# Patient Record
Sex: Female | Born: 1975 | Race: White | Hispanic: No | Smoking: Current some day smoker
Health system: Southern US, Community
[De-identification: ages and names within clinical notes are randomized; demographics above are authoritative.]

## PROBLEM LIST (undated history)

## (undated) DIAGNOSIS — S90111A Contusion of right great toe without damage to nail, initial encounter: Secondary | ICD-10-CM

## (undated) DIAGNOSIS — F1910 Other psychoactive substance abuse, uncomplicated: Secondary | ICD-10-CM

## (undated) DIAGNOSIS — F209 Schizophrenia, unspecified: Secondary | ICD-10-CM

## (undated) DIAGNOSIS — F319 Bipolar disorder, unspecified: Secondary | ICD-10-CM

## (undated) HISTORY — PX: DENTAL SURGERY: SHX609

---

## 2003-09-24 ENCOUNTER — Emergency Department (HOSPITAL_COMMUNITY): Admission: EM | Admit: 2003-09-24 | Discharge: 2003-09-24 | Payer: Self-pay | Admitting: Emergency Medicine

## 2005-10-04 ENCOUNTER — Ambulatory Visit: Payer: Self-pay | Admitting: *Deleted

## 2005-10-04 ENCOUNTER — Inpatient Hospital Stay (HOSPITAL_COMMUNITY): Admission: AD | Admit: 2005-10-04 | Discharge: 2005-10-06 | Payer: Self-pay | Admitting: Psychiatry

## 2006-10-17 ENCOUNTER — Ambulatory Visit: Payer: Self-pay | Admitting: Psychiatry

## 2006-10-17 ENCOUNTER — Emergency Department (HOSPITAL_COMMUNITY): Admission: EM | Admit: 2006-10-17 | Discharge: 2006-10-17 | Payer: Self-pay | Admitting: Emergency Medicine

## 2006-10-17 ENCOUNTER — Inpatient Hospital Stay (HOSPITAL_COMMUNITY): Admission: EM | Admit: 2006-10-17 | Discharge: 2006-10-22 | Payer: Self-pay | Admitting: Psychiatry
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
## 2010-12-09 LAB — DIFFERENTIAL
Basophils Absolute: 0
Basophils Relative: 0
Lymphs Abs: 3.5 — ABNORMAL HIGH
Monocytes Relative: 7
Neutro Abs: 9.7 — ABNORMAL HIGH

## 2010-12-09 LAB — BASIC METABOLIC PANEL
BUN: 4 — ABNORMAL LOW
CO2: 28
Calcium: 8.6
Creatinine, Ser: 0.68
GFR calc Af Amer: >60
Sodium: 135

## 2010-12-09 LAB — URINALYSIS, ROUTINE W REFLEX MICROSCOPIC
Glucose, UA: NEGATIVE
pH: 7

## 2010-12-09 LAB — ETHANOL: Alcohol, Ethyl (B): <5

## 2010-12-09 LAB — COMPREHENSIVE METABOLIC PANEL
ALT: 11
Albumin: 4
Alkaline Phosphatase: 52
Chloride: 105
GFR calc Af Amer: >60
Sodium: 141
Total Bilirubin: 0.7
Total Protein: 7

## 2010-12-09 LAB — CBC
Hemoglobin: 11.8 — ABNORMAL LOW
MCHC: 34
MCV: 90.5
RDW: 13.2
WBC: 14.4 — ABNORMAL HIGH
WBC: 9.6

## 2010-12-09 LAB — TSH: TSH: 0.227 — ABNORMAL LOW

## 2010-12-09 LAB — RAPID URINE DRUG SCREEN, HOSP PERFORMED
Amphetamines: NOT DETECTED
Barbiturates: NOT DETECTED

## 2010-12-09 LAB — URINE MICROSCOPIC-ADD ON

## 2010-12-09 LAB — PREGNANCY, URINE: Preg Test, Ur: NEGATIVE

## 2011-06-11 ENCOUNTER — Emergency Department (HOSPITAL_COMMUNITY): Payer: Medicare Other

## 2011-06-11 ENCOUNTER — Encounter (HOSPITAL_COMMUNITY): Payer: Self-pay | Admitting: *Deleted

## 2011-06-11 ENCOUNTER — Emergency Department (HOSPITAL_COMMUNITY)
Admission: EM | Admit: 2011-06-11 | Discharge: 2011-06-12 | Disposition: A | Discharge status: Other Acute Inpatient Hospital | Payer: Medicare Other | Attending: Emergency Medicine | Admitting: Emergency Medicine

## 2011-06-11 ENCOUNTER — Ambulatory Visit (HOSPITAL_COMMUNITY)
Admission: RE | Admit: 2011-06-11 | Discharge: 2011-06-11 | Disposition: A | Discharge status: Home / Self Care | Payer: Medicare Other | Source: Ambulatory Visit | Attending: Psychiatry | Admitting: Psychiatry

## 2011-06-11 DIAGNOSIS — F1110 Opioid abuse, uncomplicated: Secondary | ICD-10-CM | POA: Insufficient documentation

## 2011-06-11 DIAGNOSIS — F17200 Nicotine dependence, unspecified, uncomplicated: Secondary | ICD-10-CM | POA: Insufficient documentation

## 2011-06-11 DIAGNOSIS — F1510 Other stimulant abuse, uncomplicated: Secondary | ICD-10-CM | POA: Insufficient documentation

## 2011-06-11 DIAGNOSIS — S90111A Contusion of right great toe without damage to nail, initial encounter: Secondary | ICD-10-CM

## 2011-06-11 DIAGNOSIS — F29 Unspecified psychosis not due to a substance or known physiological condition: Secondary | ICD-10-CM | POA: Insufficient documentation

## 2011-06-11 DIAGNOSIS — D72829 Elevated white blood cell count, unspecified: Secondary | ICD-10-CM | POA: Insufficient documentation

## 2011-06-11 DIAGNOSIS — F319 Bipolar disorder, unspecified: Secondary | ICD-10-CM | POA: Insufficient documentation

## 2011-06-11 DIAGNOSIS — F209 Schizophrenia, unspecified: Secondary | ICD-10-CM | POA: Insufficient documentation

## 2011-06-11 DIAGNOSIS — B369 Superficial mycosis, unspecified: Secondary | ICD-10-CM | POA: Insufficient documentation

## 2011-06-11 HISTORY — DX: Schizophrenia, unspecified: F20.9

## 2011-06-11 HISTORY — DX: Bipolar disorder, unspecified: F31.9

## 2011-06-11 HISTORY — DX: Contusion of right great toe without damage to nail, initial encounter: S90.111A

## 2011-06-11 HISTORY — DX: Other psychoactive substance abuse, uncomplicated: F19.10

## 2011-06-11 LAB — URINALYSIS, ROUTINE W REFLEX MICROSCOPIC
Bilirubin Urine: NEGATIVE
Glucose, UA: NEGATIVE mg/dL
Specific Gravity, Urine: 1.006 (ref 1.005–1.030)
pH: 6.5 (ref 5.0–8.0)

## 2011-06-11 LAB — CBC
HCT: 40.4 % (ref 36.0–46.0)
MCH: 29.8 pg (ref 26.0–34.0)
Platelets: 305 10*3/uL (ref 150–400)

## 2011-06-11 LAB — ETHANOL: Alcohol, Ethyl (B): <11 mg/dL (ref 0–11)

## 2011-06-11 LAB — COMPREHENSIVE METABOLIC PANEL
BUN: 6 mg/dL (ref 6–23)
Chloride: 99 mEq/L (ref 96–112)
Creatinine, Ser: 0.71 mg/dL (ref 0.50–1.10)
Potassium: 3.5 mEq/L (ref 3.5–5.1)
Sodium: 136 mEq/L (ref 135–145)

## 2011-06-11 LAB — RAPID URINE DRUG SCREEN, HOSP PERFORMED
Amphetamines: NOT DETECTED
Barbiturates: NOT DETECTED
Benzodiazepines: NOT DETECTED
Cocaine: NOT DETECTED
Tetrahydrocannabinol: NOT DETECTED

## 2011-06-11 LAB — URINE MICROSCOPIC-ADD ON

## 2011-06-11 MED ORDER — NICOTINE 21 MG/24HR TD PT24
21.0000 mg | MEDICATED_PATCH | Freq: Every day | TRANSDERMAL | Status: DC
  Administered 2011-06-11 – 2011-06-12 (×2): 21 mg via TRANSDERMAL
  Filled 2011-06-11 (×2): qty 1

## 2011-06-11 MED ORDER — ZIPRASIDONE MESYLATE 20 MG IM SOLR
10.0000 mg | Freq: Once | INTRAMUSCULAR | Status: AC
  Administered 2011-06-11: 10 mg via INTRAMUSCULAR
  Filled 2011-06-11: qty 20

## 2011-06-11 MED ORDER — IBUPROFEN 200 MG PO TABS: 600.0000 mg | ORAL_TABLET | Freq: Three times a day (TID) | ORAL | Status: DC | PRN

## 2011-06-11 MED ORDER — ZOLPIDEM TARTRATE 5 MG PO TABS: 5.0000 mg | ORAL_TABLET | Freq: Every evening | ORAL | Status: DC | PRN

## 2011-06-11 MED ORDER — HYDROXYZINE HCL 10 MG PO TABS
10.0000 mg | ORAL_TABLET | Freq: Three times a day (TID) | ORAL | Status: DC | PRN
  Administered 2011-06-12 (×2): 10 mg via ORAL
  Filled 2011-06-11 (×3): qty 1

## 2011-06-11 MED ORDER — LORAZEPAM 1 MG PO TABS
1.0000 mg | ORAL_TABLET | Freq: Three times a day (TID) | ORAL | Status: DC | PRN
  Administered 2011-06-11: 1 mg via ORAL
  Filled 2011-06-11: qty 1

## 2011-06-11 MED ORDER — ALUM & MAG HYDROXIDE-SIMETH 200-200-20 MG/5ML PO SUSP: 30.0000 mL | ORAL | Status: DC | PRN

## 2011-06-11 MED ORDER — PALIPERIDONE ER 3 MG PO TB24
3.0000 mg | ORAL_TABLET | Freq: Every day | ORAL | Status: DC
  Administered 2011-06-12: 3 mg via ORAL
  Filled 2011-06-11 (×2): qty 1

## 2011-06-11 MED ORDER — ONDANSETRON HCL 4 MG PO TABS: 4.0000 mg | ORAL_TABLET | Freq: Three times a day (TID) | ORAL | Status: DC | PRN

## 2011-06-11 MED ORDER — ACETAMINOPHEN 325 MG PO TABS: 650.0000 mg | ORAL_TABLET | ORAL | Status: DC | PRN

## 2011-06-11 MED ORDER — ACETAMINOPHEN 325 MG PO TABS: 650.0000 mg | ORAL_TABLET | Freq: Once | ORAL | Status: DC

## 2011-06-11 NOTE — ED Notes (Signed)
[employer] relieved. Pt has calmed down and is quiet in darkened room.

## 2011-06-11 NOTE — ED Notes (Signed)
Patient is resting comfortably. Call light within reach, sitter is present.

## 2011-06-11 NOTE — ED Provider Notes (Addendum)
History     CSN: [id number]  Arrival date & time 06/11/11  1310   First MD Initiated Contact with Patient 06/11/11 1403      Chief Complaint  Patient presents with  . Medical Clearance    (Consider location/radiation/quality/duration/timing/severity/associated sxs/prior treatment) HPI Patient is a 36 year old female with history of prior behavioral health admissions he was taken by her mother to behavioral health today. She has a history of schizophrenia as well as bipolar disorder. Patient was evaluated at behavioral health and sent here for medical clearance. Patient's chief complaint is that she is worried about her right great toe which appears to have a slight fungal infection of the nail. Patient also endorses or redness. Patient expresses tangential thinking and disorganized thoughts. Her vital signs are stable. There are no other associated or modifying factors. Past Medical History  Diagnosis Date  . Contusion of great toe of right foot 06/11/2011  . Schizophrenia   . Bipolar 1 disorder   . Drug abuse     Past Surgical History  Procedure Date  . Dental surgery     History reviewed. No pertinent family history.  History  Substance Use Topics  . Smoking status: Current Everyday Smoker  . Smokeless tobacco: Not on file  . Alcohol Use: Yes    OB History    Grav Para Term Preterm Abortions TAB SAB Ect Mult Living                  Review of Systems  Unable to perform ROS: Psychiatric disorder    Allergies  Lamictal and Seasonal  Home Medications   Current Outpatient Rx  Name Route Sig Dispense Refill  . HYDROXYZINE HCL 10 MG PO TABS Oral Take 10 mg by mouth 3 (three) times daily as needed. Milligram dosage and frequency unspecified.  She overdosed on 06/08/2011.    Marland Kitchen PALIPERIDONE ER 3 MG PO TB24 Oral Take 3 mg by mouth every morning. Pt receives [place] IM once a month, milligram dosage unspecified.  She has not received it in the past 4 months.       BP 133/96  Pulse 100  Temp(Src) 98.9 F (37.2 C) (Oral)  Resp 20  SpO2 100%  LMP 05/08/2011  Physical Exam  Nursing note and vitals reviewed. GEN: Well-developed, well-nourished female in no distress HEENT: Atraumatic, normocephalic. Oropharynx clear without erythema EYES: PERRLA BL, no scleral icterus. NECK: Trachea midline, no meningismus CV: regular rate and rhythm. No murmurs, rubs, or gallops PULM: No respiratory distress.  No crackles, wheezes, or rales. GI: soft, non-tender. No guarding, rebound, or tenderness. + bowel sounds  GU: deferred Neuro: cranial nerves 2-12 intact, no abnormalities of strength or sensation, A and O x 3 MSK: Patient moves all 4 extremities symmetrically, no deformity, edema, or injury noted.  Patient does appear to have slight fungal infection of the right great toenail. Skin: No rashes petechiae, purpura, or jaundice. 3 scratch marks are noted over the patient's back. These do not have any surrounding erythema that suggest infection. Psych: Patient has tangential thinking. She is very disorganized. She is minimally cooperative  ED Course  Procedures (including critical care time)  Labs Reviewed  CBC - Abnormal; Notable for the following:    WBC 16.5 (*)    All other components within normal limits  COMPREHENSIVE METABOLIC PANEL - Abnormal; Notable for the following:    Total Protein 8.4 (*)    All other components within normal limits  URINALYSIS, ROUTINE W  REFLEX MICROSCOPIC - Abnormal; Notable for the following:    Hgb urine dipstick TRACE (*)    All other components within normal limits  PREGNANCY, URINE  ETHANOL  ACETAMINOPHEN LEVEL  URINE MICROSCOPIC-ADD ON  URINE RAPID DRUG SCREEN (HOSP PERFORMED)   Dg Chest Portable 1 View  06/11/2011  *RADIOLOGY REPORT*  Clinical Data: 36 year old female with confusion, psychosis and schizophrenia.  PORTABLE CHEST - 1 VIEW  Comparison: None  Findings: The cardiomediastinal silhouette is  unremarkable. There is no evidence of focal airspace disease, pulmonary edema, suspicious pulmonary nodule/mass, pleural effusion, or pneumothorax. No acute bony abnormalities are identified.  IMPRESSION: No evidence of acute cardiopulmonary disease.  Original Report Authenticated By: Rosendo Gros, M.D.     1. Psychosis       MDM  Patient was evaluated by myself. Medical clearance was remarkable for a leukocytosis of 16.5 which is nonspecific. Patient did not have a urinary tract infection or pneumonia. She had no rash on my exam. Patient's toenail appeared to have fungal infection which can be treated topically with antifungal cream. Patient had normal renal function, negative pregnancy test, normal Tylenol and alcohol level as well as negative tox screen. IVC was completed prior to her arrival. Patient is awaiting placement in a bed on the psychotic unit. Holding orders have been placed at home medications ordered. Of note patient did require a dose of Geodon 10 mg IM after removing her close multiple times. Patient was cooperative following this.       Cyndra Numbers, MD 06/11/11 1707  Cyndra Numbers, MD 06/11/11 1610  Cyndra Numbers, MD 06/11/11 1710

## 2011-06-11 NOTE — ED Notes (Signed)
Pt came out to the desk to make a phone call.  She dialed 911. She said that she needed to get out of here. She rattled off a lot of nonsense, stating she needed to call [employer], her mom, the police and she needed her numbers. She stated she has SJS and because of that, she has to go outside whether we like it or not. She was advised that she cannot leave and she wanted to know why. She was informed that she was under IVC and she then wanted to know by who and wanted to see the papers.  The papers were showed to her by [employer] who went through them with her in attempt to deescalate the situation which was successful. At present she is resting in bed watching television.

## 2011-06-11 NOTE — ED Notes (Signed)
Pt and mother present with [employer] form [facility] due to behavior problem with Patient, pt rambling in conversation and "wait for my lawyer to talk to anyone" Pt has a psy history. IVC papers with pt. Pt is having disorganized tangential thoughts
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
## 2011-06-11 NOTE — ED Notes (Signed)
Pt attempting to leave unit. Tech preoccupied pt until security could respond who informed [employer] by radio.  Pt was escorted back to room without incident and agreed to try to take Ativan. Pt sat in the floor after administration and claimed that something was trying to "go up her butt." Pt is currently calm and cooperative.

## 2011-06-11 NOTE — BHH Counselor (Signed)
Pt already evaluated by ACT at [facility] as a walk in and sent to [facility] due to need for Inptx.  Pt will be reviewed for placement at [facility] once appropriate bed is available.  No further ACT assessment is needed for today.

## 2011-06-11 NOTE — ED Notes (Signed)
Patient is resting comfortably. 

## 2011-06-11 NOTE — ED Notes (Addendum)
Pt has just taken paper scrubs off. New scrubs given. ED MD notified.

## 2011-06-12 NOTE — ED Notes (Addendum)
Multiple concerns have been expressed by pt in regards to treatment. She has questions about medication needed to treat rt great toenail, a circular rash noted on lt lower extremity, and test for STD. Informed pt that follow up maybe needed for these concerns after discharge. Will address concerns with  Concern with MD.

## 2011-06-12 NOTE — ED Notes (Signed)
Report received-airway intact, no s/s's of distress-sitter at bedside

## 2011-06-12 NOTE — ED Notes (Addendum)
Pt. Woke up and settled back in bed to eat dinner tray. She questioned possibility of going to work today. She states she works at [employer] and is to report there at 2:00pm. Explained to patient plan of care. 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

## 2011-06-12 NOTE — ED Notes (Signed)
Pt. Belongings was moved to locker #40 (2) bags

## 2011-06-12 NOTE — ED Notes (Signed)
Report called to [place], Charity fundraiser at [employer].  Phone number: [phone]. Pt is going to the C[facility], [facility]. Under care of Dr. Robet Leu.  Informed EDP, Dr. Norlene Campbell of pt. admittance to facility.

## 2011-06-12 NOTE — BH Assessment (Signed)
Per Baxter Hire at [facility], pt has been accepted. She will be going to [facility], accepted by Dr. Robet Leu. # to give report [phone]. RN Tana Conch notified and she will notify EDP.

## 2011-06-12 NOTE — ED Notes (Signed)
Pt with multiple requests-hyper verbal, manipulative at times-consistency with staff as far as rules/boundaries

## 2011-06-12 NOTE — ED Notes (Signed)
Report given to rachel, rn-transferred to rm 40

## 2011-06-12 NOTE — ED Provider Notes (Signed)
Pt accepted to [facility] by Dr. Robet Leu.    Gavin Pound. Devereaux Grayson, MD 06/12/11 1239

## 2012-08-17 ENCOUNTER — Encounter (HOSPITAL_COMMUNITY): Payer: Self-pay | Admitting: Emergency Medicine

## 2012-08-17 ENCOUNTER — Emergency Department (HOSPITAL_COMMUNITY)
Admission: EM | Admit: 2012-08-17 | Discharge: 2012-08-17 | Disposition: A | Discharge status: Home / Self Care | Payer: Medicare Other | Attending: Emergency Medicine | Admitting: Emergency Medicine

## 2012-08-17 ENCOUNTER — Emergency Department (HOSPITAL_COMMUNITY): Payer: Medicare Other

## 2012-08-17 DIAGNOSIS — F209 Schizophrenia, unspecified: Secondary | ICD-10-CM | POA: Insufficient documentation

## 2012-08-17 DIAGNOSIS — Z87828 Personal history of other (healed) physical injury and trauma: Secondary | ICD-10-CM | POA: Insufficient documentation

## 2012-08-17 DIAGNOSIS — M25569 Pain in unspecified knee: Secondary | ICD-10-CM | POA: Insufficient documentation

## 2012-08-17 DIAGNOSIS — M25562 Pain in left knee: Secondary | ICD-10-CM

## 2012-08-17 DIAGNOSIS — F17200 Nicotine dependence, unspecified, uncomplicated: Secondary | ICD-10-CM | POA: Insufficient documentation

## 2012-08-17 DIAGNOSIS — F319 Bipolar disorder, unspecified: Secondary | ICD-10-CM | POA: Insufficient documentation

## 2012-08-17 DIAGNOSIS — Z79899 Other long term (current) drug therapy: Secondary | ICD-10-CM | POA: Insufficient documentation

## 2012-08-17 MED ORDER — IBUPROFEN 800 MG PO TABS: 800.0000 mg | ORAL_TABLET | Freq: Three times a day (TID) | ORAL | Status: DC

## 2012-08-17 MED ORDER — TRAMADOL HCL 50 MG PO TABS: 50.0000 mg | ORAL_TABLET | Freq: Four times a day (QID) | ORAL | Status: DC | PRN

## 2012-08-17 NOTE — ED Notes (Signed)
Pt presents to ED with complaints of bilateral knee pain. 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

## 2012-08-17 NOTE — ED Provider Notes (Signed)
Medical screening examination/treatment/procedure(s) were performed by non-physician practitioner and as supervising physician I was immediately available for consultation/collaboration.  Ethelda Chick, MD 08/17/12 Ernestina Columbia

## 2012-08-17 NOTE — ED Provider Notes (Signed)
History  This chart was scribed for non-physician practitioner working with Ethelda Chick, MD by Greggory Stallion, ED scribe. This patient was seen in room TR05C/TR05C and the patient's care was started at 4:34 PM.  CSN: [id number]  Arrival date & time 08/17/12  1614    Chief Complaint  Patient presents with  . Knee Pain    The history is provided by the patient. No language interpreter was used.    HPI Comments: Beth Berry is a 37 y.o. female who presents to the Emergency Department complaining of bilateral knee pain that started 3 days ago. No recent injury or trauma.  Pt states she was doing a massage at work and her knees felt like they were going to give out. She states she has had pain ever since. Pain worse with bending, squatting, and standing, nothing seems to make it better. Pt states she has heard popping noises in her knees. She states she has tried Therapist, sports and ice/heat therapy without relief. Pt denies any numbness or paresthesias of LE.  No pain meds taken PTA.  Past Medical History  Diagnosis Date  . Contusion of great toe of right foot 06/11/2011  . Schizophrenia   . Bipolar 1 disorder   . Drug abuse     Past Surgical History  Procedure Laterality Date  . Dental surgery      History reviewed. No pertinent family history.  History  Substance Use Topics  . Smoking status: Current Every Day Smoker  . Smokeless tobacco: Not on file  . Alcohol Use: Yes    OB History   Grav Para Term Preterm Abortions TAB SAB Ect Mult Living                  Review of Systems  Musculoskeletal: Positive for arthralgias.  All other systems reviewed and are negative.    Allergies  Cholestatin and Lamictal  Home Medications   Current Outpatient Rx  Name  Route  Sig  Dispense  Refill  . hydrOXYzine (ATARAX/VISTARIL) 10 MG tablet   Oral   Take 10 mg by mouth 3 (three) times daily as needed. Milligram dosage and frequency unspecified.  She overdosed on 06/08/2011.          . ibuprofen (ADVIL,MOTRIN) 400 MG tablet   Oral   Take 400 mg by mouth every 6 (six) hours as needed. For pain relief         . paliperidone (INVEGA) 3 MG 24 hr tablet   Oral   Take 3 mg by mouth every morning. Pt receives [place] IM once a month, milligram dosage unspecified.  She has not received it in the past 4 months.           BP 126/78  Pulse 112  Temp(Src) 98.7 F (37.1 C) (Oral)  Resp 18  Ht 5\' 7"  (1.702 m)  Wt 194 lb (87.998 kg)  BMI 30.38 kg/m2  SpO2 99%  Physical Exam  Nursing note and vitals reviewed. Constitutional: She is oriented to person, place, and time. She appears well-developed and well-nourished.  HENT:  Head: Normocephalic and atraumatic.  Eyes: Conjunctivae and EOM are normal.  Neck: Normal range of motion. Neck supple.  Cardiovascular: Normal rate, regular rhythm and normal heart sounds.   Pulmonary/Chest: Effort normal and breath sounds normal.  Musculoskeletal: Normal range of motion. She exhibits no edema.       Right knee: She exhibits normal range of motion, no swelling, no effusion, no ecchymosis, no  deformity, no laceration, no erythema and normal alignment. Tenderness found. Medial joint line tenderness noted.       Left knee: She exhibits normal range of motion, no swelling, no effusion, no ecchymosis, no deformity, no laceration, no erythema and normal alignment. Tenderness found. Medial joint line tenderness noted.  Bilateral knee pain along medial joint lines with crepitus, full ROM maintained, normal gait, distal pulses and sensation intact  Neurological: She is alert and oriented to person, place, and time.  Skin: Skin is warm and dry.  Psychiatric: She has a normal mood and affect.    ED Course  Procedures (including critical care time)  DIAGNOSTIC STUDIES: Oxygen Saturation is 99% on RA, normal by my interpretation.    COORDINATION OF CARE: 4:52 PM-Discussed treatment plan with pt at bedside and pt agreed to  plan.   Labs Reviewed - No data to display Dg Knee 2 Views Left  08/17/2012   *RADIOLOGY REPORT*  Clinical Data: Knee pain  LEFT KNEE - 1-2 VIEW  Comparison: None.  Findings: Joint spaces well maintained.  No effusion or fracture. No arthropathy.  IMPRESSION: No acute findings.  No evidence of arthropathy.  No effusion.   Original Report Authenticated By: Genevive Bi, M.D.   Dg Knee 2 Views Right  08/17/2012   *RADIOLOGY REPORT*  Clinical Data: Knee pain and swelling  RIGHT KNEE - 1-2 VIEW  Comparison: None.  Findings: There is no fracture or  dislocation of the right knee. No joint effusion  IMPRESSION: No fracture or effusion   Original Report Authenticated By: Genevive Bi, M.D.     1. Knee pain, bilateral       MDM   Description of pain and PE findings consistent with arthritis but pt requests x-rays- both negative.  FU with ortho, Dr. Lajoyce Corners.  Rx robaxin and ibuprofen.  Discussed plan with pt, she agreed.  Return precautions advised.    I personally performed the services described in this documentation, which was scribed in my presence. The recorded information has been reviewed and is accurate.   Garlon Hatchet, PA-C 08/17/12 1843

## 2012-09-19 ENCOUNTER — Encounter (HOSPITAL_COMMUNITY): Payer: Self-pay | Admitting: *Deleted

## 2012-09-19 ENCOUNTER — Emergency Department (HOSPITAL_COMMUNITY)
Admission: EM | Admit: 2012-09-19 | Discharge: 2012-09-19 | Disposition: A | Discharge status: Home / Self Care | Payer: Medicare Other | Attending: Emergency Medicine | Admitting: Emergency Medicine

## 2012-09-19 DIAGNOSIS — F319 Bipolar disorder, unspecified: Secondary | ICD-10-CM | POA: Insufficient documentation

## 2012-09-19 DIAGNOSIS — J069 Acute upper respiratory infection, unspecified: Secondary | ICD-10-CM | POA: Insufficient documentation

## 2012-09-19 DIAGNOSIS — F209 Schizophrenia, unspecified: Secondary | ICD-10-CM | POA: Insufficient documentation

## 2012-09-19 DIAGNOSIS — J3489 Other specified disorders of nose and nasal sinuses: Secondary | ICD-10-CM | POA: Insufficient documentation

## 2012-09-19 DIAGNOSIS — R509 Fever, unspecified: Secondary | ICD-10-CM | POA: Insufficient documentation

## 2012-09-19 DIAGNOSIS — R05 Cough: Secondary | ICD-10-CM | POA: Insufficient documentation

## 2012-09-19 DIAGNOSIS — R059 Cough, unspecified: Secondary | ICD-10-CM | POA: Insufficient documentation

## 2012-09-19 DIAGNOSIS — Z79899 Other long term (current) drug therapy: Secondary | ICD-10-CM | POA: Insufficient documentation

## 2012-09-19 DIAGNOSIS — Z87828 Personal history of other (healed) physical injury and trauma: Secondary | ICD-10-CM | POA: Insufficient documentation

## 2012-09-19 DIAGNOSIS — R071 Chest pain on breathing: Secondary | ICD-10-CM | POA: Insufficient documentation

## 2012-09-19 DIAGNOSIS — F1910 Other psychoactive substance abuse, uncomplicated: Secondary | ICD-10-CM | POA: Insufficient documentation

## 2012-09-19 MED ORDER — BENZONATATE 100 MG PO CAPS: 100.0000 mg | ORAL_CAPSULE | Freq: Three times a day (TID) | ORAL | Status: DC | PRN

## 2012-09-19 MED ORDER — ALBUTEROL SULFATE HFA 108 (90 BASE) MCG/ACT IN AERS: 1.0000 | INHALATION_SPRAY | Freq: Four times a day (QID) | RESPIRATORY_TRACT | Status: DC | PRN

## 2012-09-19 NOTE — ED Provider Notes (Signed)
Medical screening examination/treatment/procedure(s) were performed by non-physician practitioner and as supervising physician I was immediately available for consultation/collaboration.   Willene Holian B. Bernette Mayers, MD 09/19/12 2112

## 2012-09-19 NOTE — ED Notes (Signed)
The pt has had a cold for 2-3 days with coughing sinus congestion and a runny nose

## 2012-09-19 NOTE — ED Notes (Signed)
Started 3 days ago with nasal congestion, cough, scratchy throat.

## 2012-09-19 NOTE — ED Provider Notes (Signed)
CSN: [id number]     Arrival date & time 09/19/12  1653 History  HPI Comments: This chart was scribed for Trixie Dredge, PA-C working with Leonette Most B. Bernette Mayers, MD by Greggory Stallion, ED scribe. This patient was seen in room TR08C/TR08C and the patient's care was started at 5:25 PM.   Chief Complaint  Patient presents with  . URI   The history is provided by the patient. No language interpreter was used.    HPI Comments: Beth Berry is a 37 y.o. female who presents to the Emergency Department complaining of gradual onset, nonproductive cough, rhinorrhea, and nasal congestion that started 4 days ago. She states she is having associated chills and body aches. She states she had some right sided chest pain last night while lying in bed that lasted about 1.5 minutes. Pt states nothing makes the symptoms better or worse. She states she had a fever of 101 before she came here but it is resolved now. Pt states she has taken generic Sudafed and cough syrup with no relief. Pt denies SOB, abdominal pain, nausea, emesis, diarrhea. She states she has not had much of an appetite the last 4 days. Has sick contacts in her fiance and fiance's mother who have both been sick with similar symptoms.  Past Medical History  Diagnosis Date  . Contusion of great toe of right foot 06/11/2011  . Schizophrenia   . Bipolar 1 disorder   . Drug abuse    Past Surgical History  Procedure Laterality Date  . Dental surgery     No family history on file. History  Substance Use Topics  . Smoking status: Current Every Day Smoker  . Smokeless tobacco: Not on file  . Alcohol Use: Yes   OB History   Grav Para Term Preterm Abortions TAB SAB Ect Mult Living                 Review of Systems  Constitutional: Positive for fever and chills.  HENT: Positive for congestion and rhinorrhea.   Respiratory: Positive for cough. Negative for shortness of breath.   Cardiovascular: Positive for chest pain.  Gastrointestinal: Negative  for nausea, vomiting, abdominal pain and diarrhea.    Allergies  Lamictal  Home Medications   Current Outpatient Rx  Name  Route  Sig  Dispense  Refill  . clonazePAM (KLONOPIN) 1 MG tablet   Oral   Take 1 mg by mouth daily.         . fluconazole (DIFLUCAN) 200 MG tablet   Oral   Take 200 mg by mouth once a week. On Fridays         . hydrOXYzine (ATARAX/VISTARIL) 25 MG tablet   Oral   Take 25 mg by mouth at bedtime as needed for itching or anxiety (sleep).          Marland Kitchen ibuprofen (ADVIL,MOTRIN) 200 MG tablet   Oral   Take 200-400 mg by mouth 2 (two) times daily as needed (menstrual cramps).         . topiramate (TOPAMAX) 25 MG tablet   Oral   Take 25 mg by mouth 2 (two) times daily.          BP 117/102  Pulse 110  Temp(Src) 98.1 F (36.7 C)  Resp 20  SpO2 98%  LMP 08/20/2012  Physical Exam  Nursing note and vitals reviewed. Constitutional: She appears well-developed and well-nourished. No distress.  HENT:  Head: Normocephalic and atraumatic.  Mouth/Throat: Oropharynx is clear and moist.  No oropharyngeal exudate.  Eyes: Conjunctivae are normal.  Neck: Neck supple.  Cardiovascular: Normal rate and regular rhythm.   Pulmonary/Chest: Effort normal and breath sounds normal. No stridor. No respiratory distress. She has no decreased breath sounds. She has no wheezes. She has no rhonchi. She has no rales.  cough  Lymphadenopathy:    She has no cervical adenopathy.  Neurological: She is alert.  Skin: She is not diaphoretic.    ED Course   Procedures (including critical care time)  DIAGNOSTIC STUDIES: Oxygen Saturation is 98% on RA, normal by my interpretation.    COORDINATION OF CARE: 5:37 PM-Discussed treatment plan which includes cough suppressant and other medications with pt at bedside and pt agreed to plan.   Labs Reviewed - No data to display No results found.  Filed Vitals:   09/19/12 1732  BP: 119/77  Pulse: 122  Temp:   Resp:      1.  URI (upper respiratory infection)     MDM  Pt with URI x 3-4 days.  Lungs CTAB, O2 98%.  HR is mid90s on recheck.  +sick contacts with similar symptoms- I am also seeing and treating her fiance who is here with her.  She is afebrile, nontoxic here.  Encouraged PO fluid increase. Discussed  findings, treatment, follow up  with patient.  Pt given return precautions.  Pt verbalizes understanding and agrees with plan.      I doubt any other EMC precluding discharge at this time including, but not necessarily limited to the following: pneumonia, PE   I personally performed the services described in this documentation, which was scribed in my presence. The recorded information has been reviewed and is accurate.   Trixie Dredge, PA-C 09/19/12 1813

## 2012-12-09 ENCOUNTER — Emergency Department (HOSPITAL_COMMUNITY): Payer: Worker's Compensation

## 2012-12-09 ENCOUNTER — Encounter (HOSPITAL_COMMUNITY): Payer: Self-pay | Admitting: Emergency Medicine

## 2012-12-09 ENCOUNTER — Emergency Department (HOSPITAL_COMMUNITY)
Admission: EM | Admit: 2012-12-09 | Discharge: 2012-12-09 | Disposition: A | Discharge status: Home / Self Care | Payer: Worker's Compensation | Attending: Emergency Medicine | Admitting: Emergency Medicine

## 2012-12-09 DIAGNOSIS — Y929 Unspecified place or not applicable: Secondary | ICD-10-CM | POA: Insufficient documentation

## 2012-12-09 DIAGNOSIS — S59909A Unspecified injury of unspecified elbow, initial encounter: Secondary | ICD-10-CM | POA: Insufficient documentation

## 2012-12-09 DIAGNOSIS — F319 Bipolar disorder, unspecified: Secondary | ICD-10-CM | POA: Insufficient documentation

## 2012-12-09 DIAGNOSIS — F209 Schizophrenia, unspecified: Secondary | ICD-10-CM | POA: Insufficient documentation

## 2012-12-09 DIAGNOSIS — Z79899 Other long term (current) drug therapy: Secondary | ICD-10-CM | POA: Insufficient documentation

## 2012-12-09 DIAGNOSIS — F17200 Nicotine dependence, unspecified, uncomplicated: Secondary | ICD-10-CM | POA: Insufficient documentation

## 2012-12-09 DIAGNOSIS — M25532 Pain in left wrist: Secondary | ICD-10-CM

## 2012-12-09 DIAGNOSIS — Y9389 Activity, other specified: Secondary | ICD-10-CM | POA: Insufficient documentation

## 2012-12-09 DIAGNOSIS — X500XXA Overexertion from strenuous movement or load, initial encounter: Secondary | ICD-10-CM | POA: Insufficient documentation

## 2012-12-09 DIAGNOSIS — S6990XA Unspecified injury of unspecified wrist, hand and finger(s), initial encounter: Secondary | ICD-10-CM | POA: Insufficient documentation

## 2012-12-09 MED ORDER — NAPROXEN 500 MG PO TABS: 500.0000 mg | ORAL_TABLET | Freq: Two times a day (BID) | ORAL | Status: DC

## 2012-12-09 NOTE — ED Notes (Signed)
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

## 2012-12-09 NOTE — Progress Notes (Signed)
Orthopedic Tech Progress Note Patient Details:  Beth Berry [birth year] [id number]  Ortho Devices Type of Ortho Device: Thumb spica splint Splint Material: Fiberglass Ortho Device/Splint Location: left UE Ortho Device/Splint Interventions: Application   Beth Berry T 12/09/2012, 8:44 PM

## 2012-12-10 NOTE — ED Provider Notes (Signed)
CSN: [id number]     Arrival date & time 12/09/12  1856 History   First MD Initiated Contact with Patient 12/09/12 1912     Chief Complaint  Patient presents with  . Wrist Pain   (Consider location/radiation/quality/duration/timing/severity/associated sxs/prior Treatment) HPI Comments: The patient presents with Left wrist pain for 1 week. 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 Pain with active flexion and extension. Reports swelling. She reports icing the area without relief.  Denies fever, elbow or shoulder pain.       Patient is a 37 y.o. female presenting with wrist pain. The history is provided by the patient.  Wrist Pain    Past Medical History  Diagnosis Date  . Contusion of great toe of right foot 06/11/2011  . Schizophrenia   . Bipolar 1 disorder   . Drug abuse    Past Surgical History  Procedure Laterality Date  . Dental surgery     No family history on file. History  Substance Use Topics  . Smoking status: Current Every Day Smoker  . Smokeless tobacco: Not on file  . Alcohol Use: Yes   OB History   Grav Para Term Preterm Abortions TAB SAB Ect Mult Living                 Review of Systems  All other systems reviewed and are negative.    Allergies  Lamictal  Home Medications   Current Outpatient Rx  Name  Route  Sig  Dispense  Refill  . ARIPiprazole (ABILIFY) 30 MG tablet   Oral   Take 30 mg by mouth daily.         . clonazePAM (KLONOPIN) 1 MG tablet   Oral   Take 1 mg by mouth daily as needed for anxiety.          . fluconazole (DIFLUCAN) 200 MG tablet   Oral   Take 200 mg by mouth once a week. On Fridays         . hydrOXYzine (ATARAX/VISTARIL) 25 MG tablet   Oral   Take 25 mg by mouth at bedtime as needed for itching or anxiety (sleep).          Marland Kitchen ibuprofen (ADVIL,MOTRIN) 200 MG tablet   Oral   Take 200-400 mg by mouth 2 (two) times daily as needed (menstrual  cramps).         . naproxen sodium (ANAPROX) 220 MG tablet   Oral   Take 440 mg by mouth daily as needed (for pain).         . naproxen (NAPROSYN) 500 MG tablet   Oral   Take 1 tablet (500 mg total) by mouth 2 (two) times daily.   30 tablet   0    BP 128/80  Pulse 98  Temp(Src) 97.8 F (36.6 C) (Oral)  Resp 18  Wt 209 lb 9 oz (95.057 kg)  BMI 32.81 kg/m2  SpO2 98%  LMP 12/09/2012 Physical Exam  Nursing note and vitals reviewed. Constitutional: She appears well-developed and well-nourished.  HENT:  Head: Normocephalic and atraumatic.  Eyes: EOM are normal.  Neck: Neck supple.  Cardiovascular: Normal rate, regular rhythm and normal heart sounds.   Patient not tachycardic during exam.   Pulmonary/Chest: Effort normal. She has no wheezes.  Musculoskeletal: Normal range of motion.       Left shoulder: Normal. She exhibits no tenderness.  Left elbow: Normal. No tenderness found.       Left wrist: She exhibits tenderness and bony tenderness. She exhibits no crepitus and no deformity.       Left forearm: She exhibits tenderness and swelling.  Tenderness to palpation over anatomical snuff box.  Pain with active flexion and extension.  Normal strength.    Neurological: She is alert.  Skin: Skin is warm and dry.    ED Course  Procedures (including critical care time)  Dg Wrist Complete Left  12/09/2012   CLINICAL DATA:  Pain  EXAM: LEFT WRIST - COMPLETE 3+ VIEW  COMPARISON:  None.  FINDINGS: Frontal, oblique, lateral, and ulnar deviation scaphoid images were obtained. There is no fracture or dislocation. Joint spaces appear intact. No erosive change.  IMPRESSION: No abnormality noted.   Electronically Signed   By: Bretta Bang M.D.   On: 12/09/2012 19:39    MDM   1. Wrist pain, left    Patient presents with Left wrist pain for 8 days, reports mechanism as overuse but tenderness recreated with palpation to anatomical snuff box.  Will place in thumb spica and  have follow up with hand or ortho.    Clabe Seal, PA-C 12/11/12 2222

## 2012-12-12 NOTE — ED Provider Notes (Signed)
Medical screening examination/treatment/procedure(s) were performed by non-physician practitioner and as supervising physician I was immediately available for consultation/collaboration.   Junius Argyle, MD 12/12/12 [phone]

## 2015-01-31 ENCOUNTER — Emergency Department (HOSPITAL_COMMUNITY)
Admission: EM | Admit: 2015-01-31 | Discharge: 2015-01-31 | Disposition: A | Discharge status: Home / Self Care | Payer: Medicare Other | Attending: Emergency Medicine | Admitting: Emergency Medicine

## 2015-01-31 ENCOUNTER — Emergency Department (HOSPITAL_COMMUNITY): Payer: Medicare Other

## 2015-01-31 ENCOUNTER — Encounter (HOSPITAL_COMMUNITY): Payer: Self-pay

## 2015-01-31 DIAGNOSIS — Z79899 Other long term (current) drug therapy: Secondary | ICD-10-CM | POA: Insufficient documentation

## 2015-01-31 DIAGNOSIS — F17210 Nicotine dependence, cigarettes, uncomplicated: Secondary | ICD-10-CM | POA: Insufficient documentation

## 2015-01-31 DIAGNOSIS — Z87828 Personal history of other (healed) physical injury and trauma: Secondary | ICD-10-CM | POA: Insufficient documentation

## 2015-01-31 DIAGNOSIS — M25511 Pain in right shoulder: Secondary | ICD-10-CM

## 2015-01-31 DIAGNOSIS — F209 Schizophrenia, unspecified: Secondary | ICD-10-CM | POA: Diagnosis not present

## 2015-01-31 DIAGNOSIS — F319 Bipolar disorder, unspecified: Secondary | ICD-10-CM | POA: Insufficient documentation

## 2015-01-31 MED ORDER — NAPROXEN 500 MG PO TABS
500.0000 mg | ORAL_TABLET | Freq: Once | ORAL | Status: AC
  Administered 2015-01-31: 500 mg via ORAL
  Filled 2015-01-31: qty 1

## 2015-01-31 MED ORDER — NAPROXEN 500 MG PO TABS: 500.0000 mg | ORAL_TABLET | Freq: Two times a day (BID) | ORAL | Status: DC

## 2015-01-31 MED ORDER — METHOCARBAMOL 500 MG PO TABS: 500.0000 mg | ORAL_TABLET | Freq: Two times a day (BID) | ORAL | Status: DC

## 2015-01-31 MED ORDER — METHOCARBAMOL 500 MG PO TABS
500.0000 mg | ORAL_TABLET | Freq: Once | ORAL | Status: AC
  Administered 2015-01-31: 500 mg via ORAL
  Filled 2015-01-31: qty 1

## 2015-01-31 NOTE — ED Notes (Signed)
Pt c/o R shoulder pain and limited ROM starting this morning.  Pain score 10/10.  Pt has not taken anything for pain.  Denies injury.  Sts she woke up w/ the pain.  Denies numbness and tingling.

## 2015-01-31 NOTE — ED Notes (Signed)
Awake. Verbally responsive. A/O x4. Resp even and unlabored. No audible adventitious breath sounds noted. ABC's intact.  

## 2015-01-31 NOTE — ED Notes (Signed)
Bed: WA27 Expected date:  Expected time:  Means of arrival:  Comments: 

## 2015-01-31 NOTE — Discharge Instructions (Signed)
1. Medications: naproxen, robaxin, usual home medications 2. Treatment: rest, drink plenty of fluids; you can continue to do gentle stretching and massage; use ice or heat for symptoms 3. Follow Up: please followup with orthopedics with persistent or worsening symptoms for discussion of your diagnoses and further evaluation after today's visit; if you do not have a primary care doctor use the resource guide provided to find one; please return to the ER for high fever, severe pain, numbness, weakness, new or worsening symptoms   Shoulder Pain The shoulder is the joint that connects your arms to your body. The bones that form the shoulder joint include the upper arm bone (humerus), the shoulder blade (scapula), and the collarbone (clavicle). The top of the humerus is shaped like a ball and fits into a rather flat socket on the scapula (glenoid cavity). A combination of muscles and strong, fibrous tissues that connect muscles to bones (tendons) support your shoulder joint and hold the ball in the socket. Small, fluid-filled sacs (bursae) are located in different areas of the joint. They act as cushions between the bones and the overlying soft tissues and help reduce friction between the gliding tendons and the bone as you move your arm. Your shoulder joint allows a wide range of motion in your arm. This range of motion allows you to do things like scratch your back or throw a ball. However, this range of motion also makes your shoulder more prone to pain from overuse and injury. Causes of shoulder pain can originate from both injury and overuse and usually can be grouped in the following four categories: 1. Redness, swelling, and pain (inflammation) of the tendon (tendinitis) or the bursae (bursitis). 2. Instability, such as a dislocation of the joint. 3. Inflammation of the joint (arthritis). 4. Broken bone (fracture). HOME CARE INSTRUCTIONS  1. Apply ice to the sore area.  Put ice in a plastic  bag.  Place a towel between your skin and the bag.  Leave the ice on for 15-20 minutes, 3-4 times per day for the first 2 days, or as directed by your health care provider. 2. Stop using cold packs if they do not help with the pain. 3. If you have a shoulder sling or immobilizer, wear it as long as your caregiver instructs. Only remove it to shower or bathe. Move your arm as little as possible, but keep your hand moving to prevent swelling. 4. Squeeze a soft ball or foam pad as much as possible to help prevent swelling. 5. Only take over-the-counter or prescription medicines for pain, discomfort, or fever as directed by your caregiver. SEEK MEDICAL CARE IF:  1. Your shoulder pain increases, or new pain develops in your arm, hand, or fingers. 2. Your hand or fingers become cold and numb. 3. Your pain is not relieved with medicines. SEEK IMMEDIATE MEDICAL CARE IF:  1. Your arm, hand, or fingers are numb or tingling. 2. Your arm, hand, or fingers are significantly swollen or turn white or blue. MAKE SURE YOU:  1. Understand these instructions. 2. Will watch your condition. 3. Will get help right away if you are not doing well or get worse.   This information is not intended to replace advice given to you by your health care provider. Make sure you discuss any questions you have with your health care provider.   Document Released: 11/23/2004 Document Revised: 03/06/2014 Document Reviewed: 06/08/2014 Elsevier Interactive Patient Education 2016 [employer].  Shoulder Range of Motion Exercises Shoulder range  of motion (ROM) exercises are designed to keep the shoulder moving freely. They are often recommended for people who have shoulder pain. MOVEMENT EXERCISE When you are able, do this exercise 5-6 days per week, or as told by your health care provider. Work toward doing 2 sets of 10 swings. Pendulum Exercise How To Do This Exercise Lying Down 5. Lie face-down on a bed with your abdomen  close to the side of the bed. 6. Let your arm hang over the side of the bed. 7. Relax your shoulder, arm, and hand. 8. Slowly and gently swing your arm forward and back. Do not use your neck muscles to swing your arm. They should be relaxed. If you are struggling to swing your arm, have someone gently swing it for you. When you do this exercise for the first time, swing your arm at a 15 degree angle for 15 seconds, or swing your arm 10 times. As pain lessens over time, increase the angle of the swing to 30-45 degrees. 9. Repeat steps 1-4 with the other arm. How To Do This Exercise While Standing 6. Stand next to a sturdy chair or table and hold on to it with your hand.  Bend forward at the waist.  Bend your knees slightly.  Relax your other arm and let it hang limp.  Relax the shoulder blade of the arm that is hanging and let it drop.  While keeping your shoulder relaxed, use body motion to swing your arm in small circles. The first time you do this exercise, swing your arm for about 30 seconds or 10 times. When you do it next time, swing your arm for a little longer.  Stand up tall and relax.  Repeat steps 1-7, this time changing the direction of the circles. 7. Repeat steps 1-8 with the other arm. STRETCHING EXERCISES Do these exercises 3-4 times per day on 5-6 days per week or as told by your health care provider. Work toward holding the stretch for 20 seconds. Stretching Exercise 1 4. Lift your arm straight out in front of you. 5. Bend your arm 90 degrees at the elbow (right angle) so your forearm goes across your body and looks like the letter "L." 6. Use your other arm to gently pull the elbow forward and across your body. 7. Repeat steps 1-3 with the other arm. Stretching Exercise 2 You will need a towel or rope for this exercise. 3. Bend one arm behind your back with the palm facing outward. 4. Hold a towel with your other hand. 5. Reach the arm that holds the towel above  your head, and bend that arm at the elbow. Your wrist should be behind your neck. 6. Use your free hand to grab the free end of the towel. 7. With the higher hand, gently pull the towel up behind you. 8. With the lower hand, pull the towel down behind you. 9. Repeat steps 1-6 with the other arm. STRENGTHENING EXERCISES Do each of these exercises at four different times of day (sessions) every day or as told by your health care provider. To begin with, repeat each exercise 5 times (repetitions). Work toward doing 3 sets of 12 repetitions or as told by your health care provider. Strengthening Exercise 1 You will need a light weight for this activity. As you grow stronger, you may use a heavier weight. 4. Standing with a weight in your hand, lift your arm straight out to the side until it is at the same  height as your shoulder. 5. Bend your arm at 90 degrees so that your fingers are pointing to the ceiling. 6. Slowly raise your hand until your arm is straight up in the air. 7. Repeat steps 1-3 with the other arm. Strengthening Exercise 2 You will need a light weight for this activity. As you grow stronger, you may use a heavier weight. 1. Standing with a weight in your hand, gradually move your straight arm in an arc, starting at your side, then out in front of you, then straight up over your head. 2. Gradually move your other arm in an arc, starting at your side, then out in front of you, then straight up over your head. 3. Repeat steps 1-2 with the other arm. Strengthening Exercise 3 You will need an elastic band for this activity. As you grow stronger, gradually increase the size of the bands or increase the number of bands that you use at one time. 1. While standing, hold an elastic band in one hand and raise that arm up in the air. 2. With your other hand, pull down the band until that hand is by your side. 3. Repeat steps 1-2 with the other arm.   This information is not intended to  replace advice given to you by your health care provider. Make sure you discuss any questions you have with your health care provider.   Document Released: 11/12/2002 Document Revised: 06/30/2014 Document Reviewed: 02/09/2014 Elsevier Interactive Patient Education 2016 [employer].   Emergency Department Resource Guide 1) Find a Doctor and Pay Out of Pocket Although you won't have to find out who is covered by your insurance plan, it is a good idea to ask around and get recommendations. You will then need to call the office and see if the doctor you have chosen will accept you as a new patient and what types of options they offer for patients who are self-pay. Some doctors offer discounts or will set up payment plans for their patients who do not have insurance, but you will need to ask so you aren't surprised when you get to your appointment.  2) Contact Your Local Health Department Not all health departments have doctors that can see patients for sick visits, but many do, so it is worth a call to see if yours does. If you don't know where your local health department is, you can check in your phone book. The CDC also has a tool to help you locate your state's health department, and many state websites also have listings of all of their local health departments.  3) Find a Walk-in Clinic If your illness is not likely to be very severe or complicated, you may want to try a walk in clinic. These are popping up all over the country in pharmacies, drugstores, and shopping centers. They're usually staffed by nurse practitioners or physician assistants that have been trained to treat common illnesses and complaints. They're usually fairly quick and inexpensive. However, if you have serious medical issues or chronic medical problems, these are probably not your best option.  No Primary Care Doctor: - Call Health Connect at  [phone] - they can help you locate a primary care doctor that  accepts your  insurance, provides certain services, etc. - Physician Referral Service- [phone]  Chronic Pain Problems: Organization         Address  Phone   Notes  [facility]  [phone] Patients need to be referred by their  primary care doctor.   Medication Assistance: Organization         Address  Phone   Notes  [facility] Medication [facility] [address] [place], [place] [address] [phone] --Must be a resident of [facility] -- Must have NO insurance coverage whatsoever (no Medicaid/ Medicare, etc.) -- The pt. MUST have a primary care doctor that directs their care regularly and follows them in the community   [facility]ist  [phone]   [employer]  [phone]    Agencies that provide inexpensive medical care: Organization         Address  Phone   Notes  [facility]  [phone]   [facility]    [phone]   [facility] [address] [place], [place] [address] [phone]   [facility] [address] [place] [phone]   Planned Parenthood    [phone]   [facility]    [phone]   [facility]  [address], [place] Phone:  [phone], Fax:  [phone] Hours of Operation:  9 am - 6 pm, M-F.  Also accepts Medicaid/Medicare and self-pay.  [facility]  [address], [place] Phone: [phone], Fax: [phone]. Hours of Operation:  8:30 am - 5:30 pm, M-F.  Also accepts Medicaid and self-pay.  [facility] [place] [address] [place] Phone: [phone]   [facility] [address] Natasha Bence [place], [place] [phone], Ext. [phone] Mondays & Thursdays: 7-9 AM.  First 15 patients are seen on a first come, first serve basis.    Medicaid-accepting [facility] Providers:  Organization          Address  Phone   Notes  [facility] [address],  [phone] Also accepts self-pay patients.  [facility] [address] Laurell Josephs [place], [place]  [phone]   [facility] [address], [place] [phone]   [facility] [address], [place] [phone]   Renaye Rakers [address], [place]   [phone] Only accepts [place] patients after they have their name applied to their card.   Self-Pay (no insurance) in [facility]:  Organization         Address  Phone   Notes  Sickle Cell Patients, [facility] [address] [place], [place] [phone]   [facility] [address] [place], [place] [phone]   [facility] [place]  [address], [place] [phone]   [facility]/Dr. Osei-Bonsu  [address], [employer] or [address], [place] [phone] Phone number for both [place] and [place] locations is the same.  [facility] [address] [place] [phone]   [facility] [address] [place] or [address] [phone]   [facility] [address] [place] [phone], phone; [phone], fax Sees patients 1st and 3rd Saturday of every month.  Must not qualify for public or private insurance (i.e. Medicaid, Medicare, [place] Health Choice, Veterans' Benefits)  Household income should be no more than 200% of  the poverty level The clinic cannot treat you if you are pregnant or think you are pregnant  Sexually transmitted diseases are not treated at the clinic.    Dental Care: Organization         Address  Phone  Notes  [facility] [address],  [place] [phone] Accepts children up to age 37 who are enrolled in [place] or [place] Health Choice; pregnant women with a Medicaid card; and children who have applied for Medicaid or [place] Health Choice, but were declined, whose parents can pay a reduced fee at time of service.  [facility] [place]  [address], [place] [phone] Accepts children up to age 37 who are enrolled in [place] or [place] Health Choice; pregnant women with a Medicaid card; and children who have applied for Medicaid or [place] Health Choice, but were declined, whose parents can pay a reduced fee at time of service.  [facility]  [address], [place] [phone] Patients are seen by appointment only. Walk-ins are not accepted. [facility] will see patients 37 years of age and older. Monday - Tuesday (8am-5pm) Most Wednesdays (8:30-5pm) $30 per visit, cash only  [facility]  [address], [facility] [phone] Patients are seen by appointment only. Walk-ins are not accepted. [facility] will see patients 37 years of age and older. One Wednesday Evening (Monthly: Volunteer Based).  $30 per visit, cash only  [employer] [facility]  [phone] for adults; Children under age 954, call Graduate Pediatric Dentistry at [phone]. Children aged 444-14, please call [phone] to request a pediatric application.  Dental services are provided in all areas of dental care including fillings, crowns and bridges, complete and partial dentures, implants, gum treatment, root canals, and extractions. Preventive care is also provided. Treatment is provided to both adults and children. Patients are selected via a lottery and there is often a waiting list.   [facility] [address], [place]  [phone]) [phone] [email or web address]   [facility] [address], [place], [place]  [phone], Ext. [phone] Second and Fourth Thursday of each month, opens at 6:30 AM; Clinic ends at 9 AM.  Patients are seen on a first-come first-served basis, and a limited number are seen during each clinic.   [facility]  [address], [place], [place] [phone]   Eligibility Requirements You must have lived in [place], [place], or [place] counties for at least the last three months.   You cannot be eligible for state or federal sponsored [place]healthcare insurance, including CIGNAVeterans Administration, [place]icaid, or [employer]icare.   You generally cannot be eligible for healthcare insurance through your employer.    How to apply: Eligibility screenings are held every Tuesday and Wednesday afternoon from 1:00 pm until 4:00 pm. You do not need an appointment for the interview!  [facility] [address], [place], [place] [phone]   [facility]  [phone]   [facility]  [phone]   [facility]  [phone]    Behavioral Health Resources in the Community: Intensive Outpatient Programs Organization         Address  Phone  Notes  [facility] [address], [place], [place] [phone]   [facility] [address], [place], [place] [phone]   [facility] [address], [place], [place]  [phone]   [facility] [phone] N. [address]  [place], [place] [phone] or [phone]   Substance Abuse Resources Organization         Address  Phone  Notes  [facility] Drug Services  [phone]   [facility]  [phone]   [facility]  [phone]   Floydene Flock  [phone]   Residential & Outpatient Substance Abuse Program  [phone]   Psychological Services Organization         Address  Phone  Notes  [facility] Behavioral Health  [phone]   [facility] Services  [phone]    [facility] Mental Health [address], [place] [phone] or [phone]    Mobile Crisis Teams Organization         Address  Phone  Notes  [facility], Mobile Crisis Care Unit  [phone]   [facility]  [address] [place], [place] [phone]   Doristine Locks [address] [place] [phone]    Self-Help/Support Groups Organization         Address  Phone             Notes  [facility] [place] - variety of support groups  [phone]- I7437963 Call for more information  Narcotics Anonymous (NA), [facility] [address], [employer] [place]  2 meetings at this location   Statistician         Address  Phone  Notes  [facility] [address],    [place]  [phone]   [facility]  [address] [place] [address], [place], [place] [phone]   [facility] [address] [place], [place] [phone] Admissions: 8am-3pm M-F  [facility] [address],    [place], [place] [phone]   [facility] [address] [place], [place], [place] [phone]   [facility] [address]  [place], [place] [phone]   [facility] - Intensive Outpatient [address] Laurell Josephs [address], [place], [place] [phone]   [facility] ([facility] [address]  [place], [place] [phone] or [phone]   [facility] ([facility]) [address] [place], [place] [phone] Accepts Medicaid  [facility] [address],  [place] [phone] Substance Abuse/Addiction Treatment   [facility] Organization         Address  Phone  Notes  [facility]  [phone]   Angie Fava, PhD [address] Ervin Knack [place], [place]   [phone] or [phone]   [facility] Behavioral   [address] [place], [place] [phone]   [facility] [address], [place], [place] [phone] Insurance/Medicaid/sponsorship through [facility] [address]                                    [place], [place] [phone] Therapy/tele-psych/case  [facility] [address], [place] [phone]    Dr. Lolly Mustache  [phone]   [facility]  [employer] [facility] Dept. 1) [address], [place] 2) [address], [place] 3)  [address],  [place] [phone]  [phone]   [place] [phone] or [phone] (After Hours)

## 2015-01-31 NOTE — ED Provider Notes (Signed)
CSN: [id number]     Arrival date & time 01/31/15  1022 History   First MD Initiated Contact with Patient 01/31/15 1049     Chief Complaint  Patient presents with  . Shoulder Pain    HPI   Beth Berry is a 39 y.o. female with a PMH of bipolar disorder, schizophrenia, drug abuse who presents to the ED with right shoulder pain, which she noticed when she woke up this morning. She reports movement exacerbates her pain. She has tried massage and stretching for symptom relief, which has not been effective. 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 She denies fever, chills, numbness, weakness, paresthesia, recent injury. She reports decreased range of motion due to pain.   Past Medical History  Diagnosis Date  . Contusion of great toe of right foot 06/11/2011  . Schizophrenia (HCC)   . Bipolar 1 disorder (HCC)   . Drug abuse    Past Surgical History  Procedure Laterality Date  . Dental surgery     History reviewed. No pertinent family history. Social History  Substance Use Topics  . Smoking status: Current Some Day Smoker    Types: Cigarettes  . Smokeless tobacco: None  . Alcohol Use: Yes     Comment: rarely   OB History    No data available      Review of Systems  Constitutional: Negative for fever and chills.  Musculoskeletal: Positive for arthralgias.  Neurological: Negative for weakness and numbness.      Allergies  Lamictal  Home Medications   Prior to Admission medications   Medication Sig Start Date End Date Taking? Authorizing Provider  ARIPiprazole (ABILIFY) 30 MG tablet Take 30 mg by mouth daily.   Yes Historical Provider, MD  clonazePAM (KLONOPIN) 1 MG tablet Take 1 mg by mouth daily as needed for anxiety.    Yes Historical Provider, MD  hydrOXYzine (ATARAX/VISTARIL) 25 MG tablet Take 25 mg by mouth at bedtime as needed for itching or anxiety  (sleep).    Yes Historical Provider, MD  ibuprofen (ADVIL,MOTRIN) 200 MG tablet Take 200-400 mg by mouth 2 (two) times daily as needed (menstrual cramps).   Yes Historical Provider, MD  topiramate (TOPAMAX) 25 MG tablet Take 50 mg by mouth every morning. 12/31/14  Yes Historical Provider, MD  methocarbamol (ROBAXIN) 500 MG tablet Take 1 tablet (500 mg total) by mouth 2 (two) times daily. 01/31/15   Mady Gemma, PA-C  naproxen (NAPROSYN) 500 MG tablet Take 1 tablet (500 mg total) by mouth 2 (two) times daily with a meal. 01/31/15   Mady Gemma, PA-C    BP 114/74 mmHg  Pulse 88  Temp(Src) 98 F (36.7 C) (Oral)  Resp 20  SpO2 99%  LMP 01/08/2015 Physical Exam  Constitutional: She is oriented to person, place, and time. She appears well-developed and well-nourished. No distress.  HENT:  Head: Normocephalic and atraumatic.  Right Ear: External ear normal.  Left Ear: External ear normal.  Nose: Nose normal.  Eyes: Conjunctivae and EOM are normal. Right eye exhibits no discharge. Left eye exhibits no discharge. No scleral icterus.  Neck: Normal range of motion. Neck supple.  Cardiovascular: Normal rate and regular rhythm.   Pulmonary/Chest: Effort normal and breath sounds normal. No respiratory distress.  Musculoskeletal: She exhibits tenderness. She exhibits no edema.  Diffuse tenderness to palpation of right  shoulder with decreased range of motion due to pain. No edema, erythema, or heat. Distal pulses intact. Compartments soft.  Neurological: She is alert and oriented to person, place, and time.  Skin: Skin is warm and dry. She is not diaphoretic.  Psychiatric: She has a normal mood and affect. Her behavior is normal.  Nursing note and vitals reviewed.   ED Course  Procedures (including critical care time)  Labs Review Labs Reviewed - No data to display  Imaging Review Dg Shoulder Right  01/31/2015  CLINICAL DATA:  Right superior and lateral shoulder pain radiating  to mid right humerus. No known injury. EXAM: RIGHT SHOULDER - 2+ VIEW COMPARISON:  None. FINDINGS: There is no evidence of fracture or dislocation. There is no evidence of arthropathy or other focal bone abnormality. Soft tissues are unremarkable. IMPRESSION: Negative. Electronically Signed   By: Bary RichardStan  Maynard M.D.   On: 01/31/2015 11:20     I have personally reviewed and evaluated these images as part of my medical decision-making.   EKG Interpretation None      MDM   Final diagnoses:  Right shoulder pain    39 year old female presents with right shoulder pain since this morning. Denies fever, chills, numbness, weakness, paresthesia, recent injury. Patient is afebrile. Vital signs stable. On exam, patient has diffuse tenderness to palpation of right shoulder with decreased range of motion due to pain. No edema, erythema, or heat. Distal pulses intact. Compartments soft.  Patient given naproxen and robaxin for symptoms. Will obtain imaging of right shoulder given tenderness and decreased range of motion.  Imaging of right shoulder negative for fracture, dislocation, arthropathy, focal bone abnormality, soft tissue swelling. Symptoms most likely muscular, will treat with naproxen and robaxin. Patient to follow-up with orthopedics for persistent or worsening symptoms. Return precautions discussed. Patient verbalizes her understanding and is in agreement with plan.  BP 114/74 mmHg  Pulse 88  Temp(Src) 98 F (36.7 C) (Oral)  Resp 20  SpO2 99%  LMP 01/08/2015      Mady GemmaElizabeth C Onisha Cedeno, PA-C 01/31/15 1130  Gerhard Munchobert Lockwood, MD 01/31/15 [phone]

## 2015-03-26 IMAGING — CR DG KNEE 1-2V*R*
2 series · 2 of 2 positions shown · non-contrast
Comparison: None.

CLINICAL DATA: Knee pain and swelling

RIGHT KNEE - 1-2 VIEW

[t knee ap right]
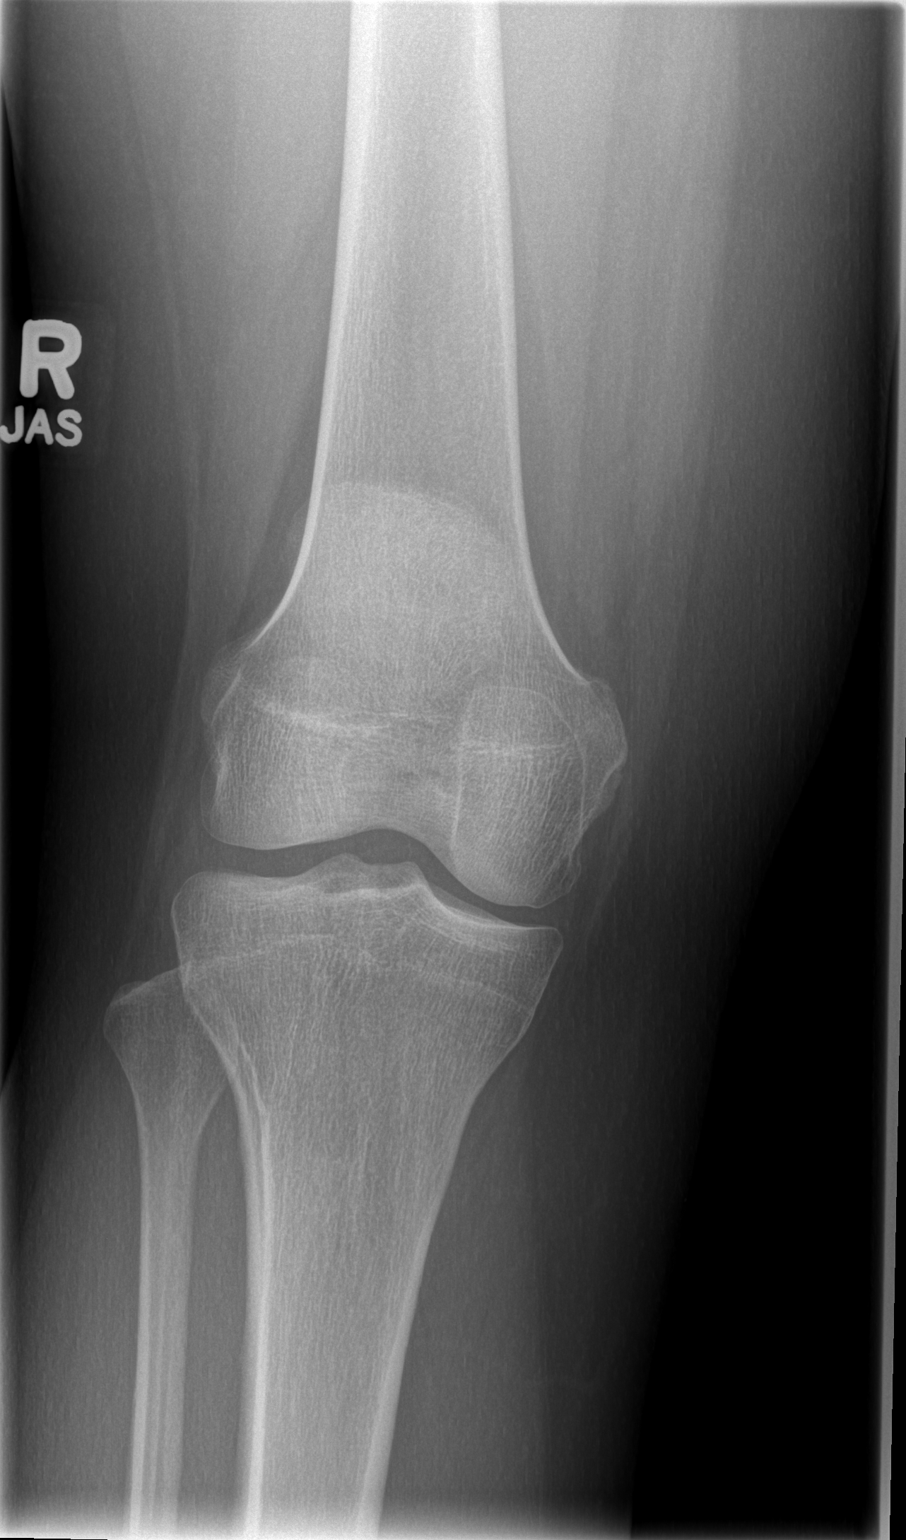

[t knee lat right]
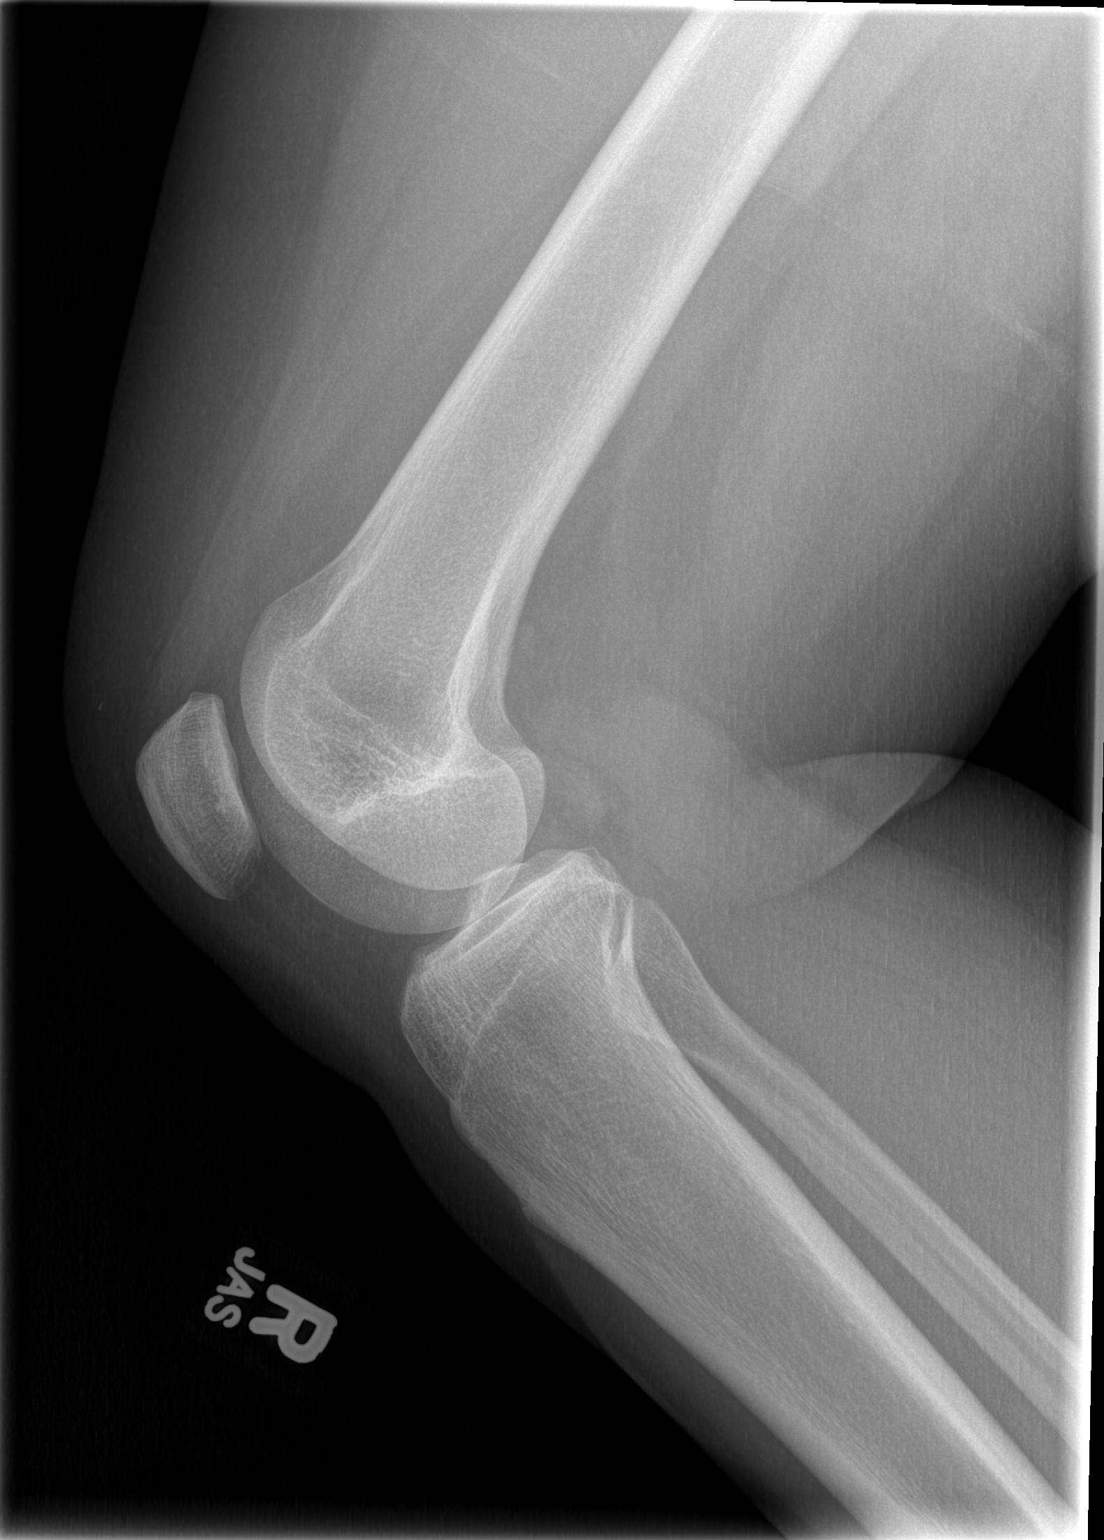

[2 of 2 positions shown; findings below may reference images not displayed]

FINDINGS: There is no fracture or  dislocation of the right knee.
No joint effusion
IMPRESSION: No fracture or effusion

## 2015-03-26 IMAGING — CR DG KNEE 1-2V*L*
2 series · 2 of 2 positions shown · non-contrast
Comparison: None.

CLINICAL DATA: Knee pain

LEFT KNEE - 1-2 VIEW

[t knee ap left]
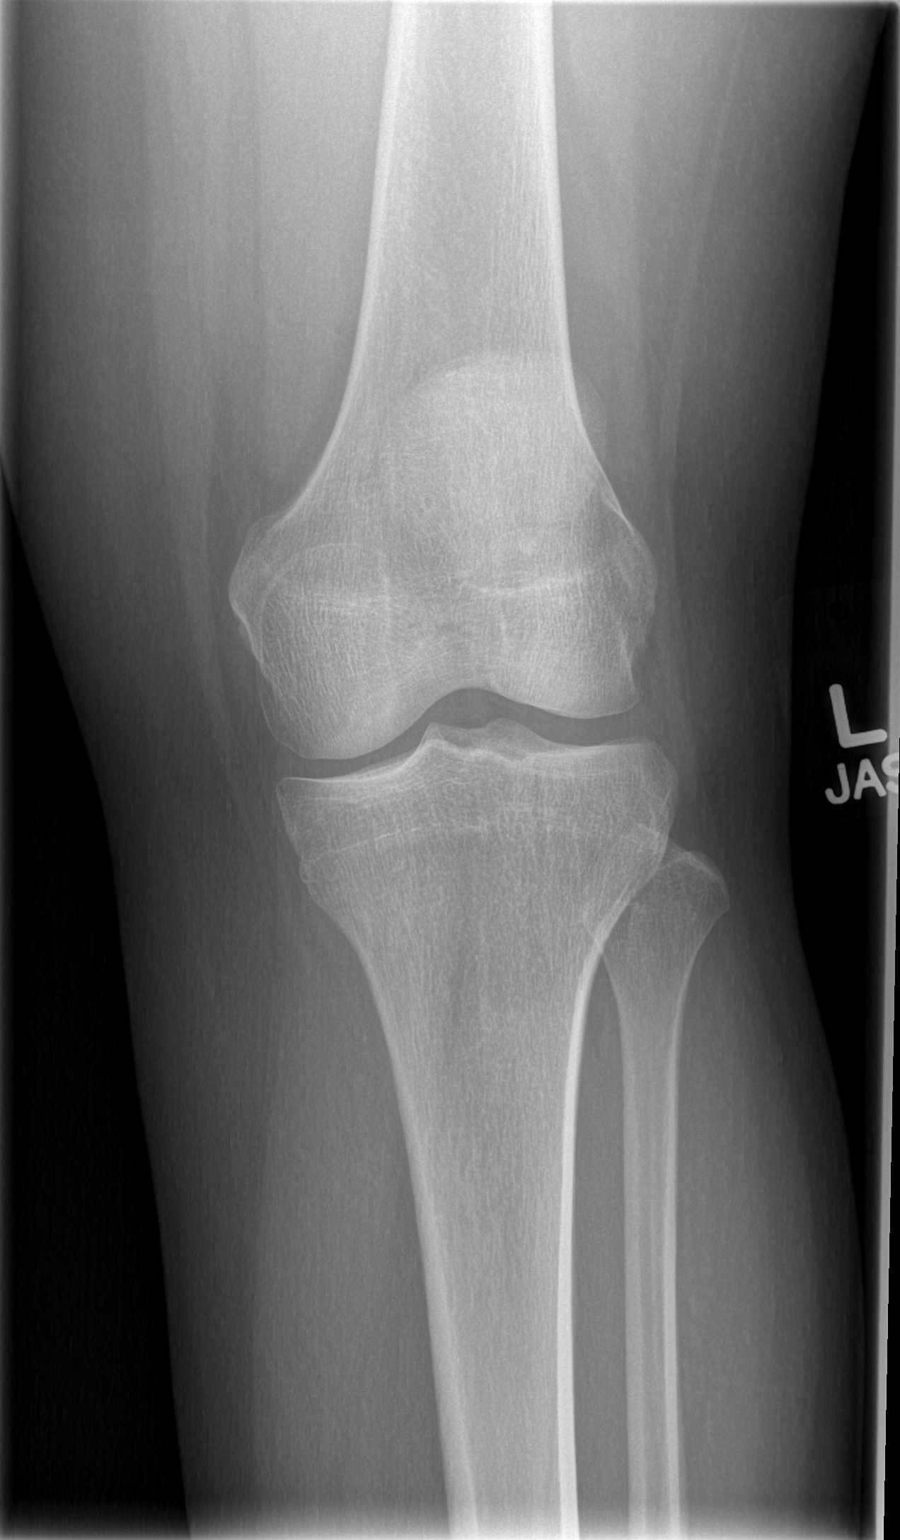

[t knee lat left]
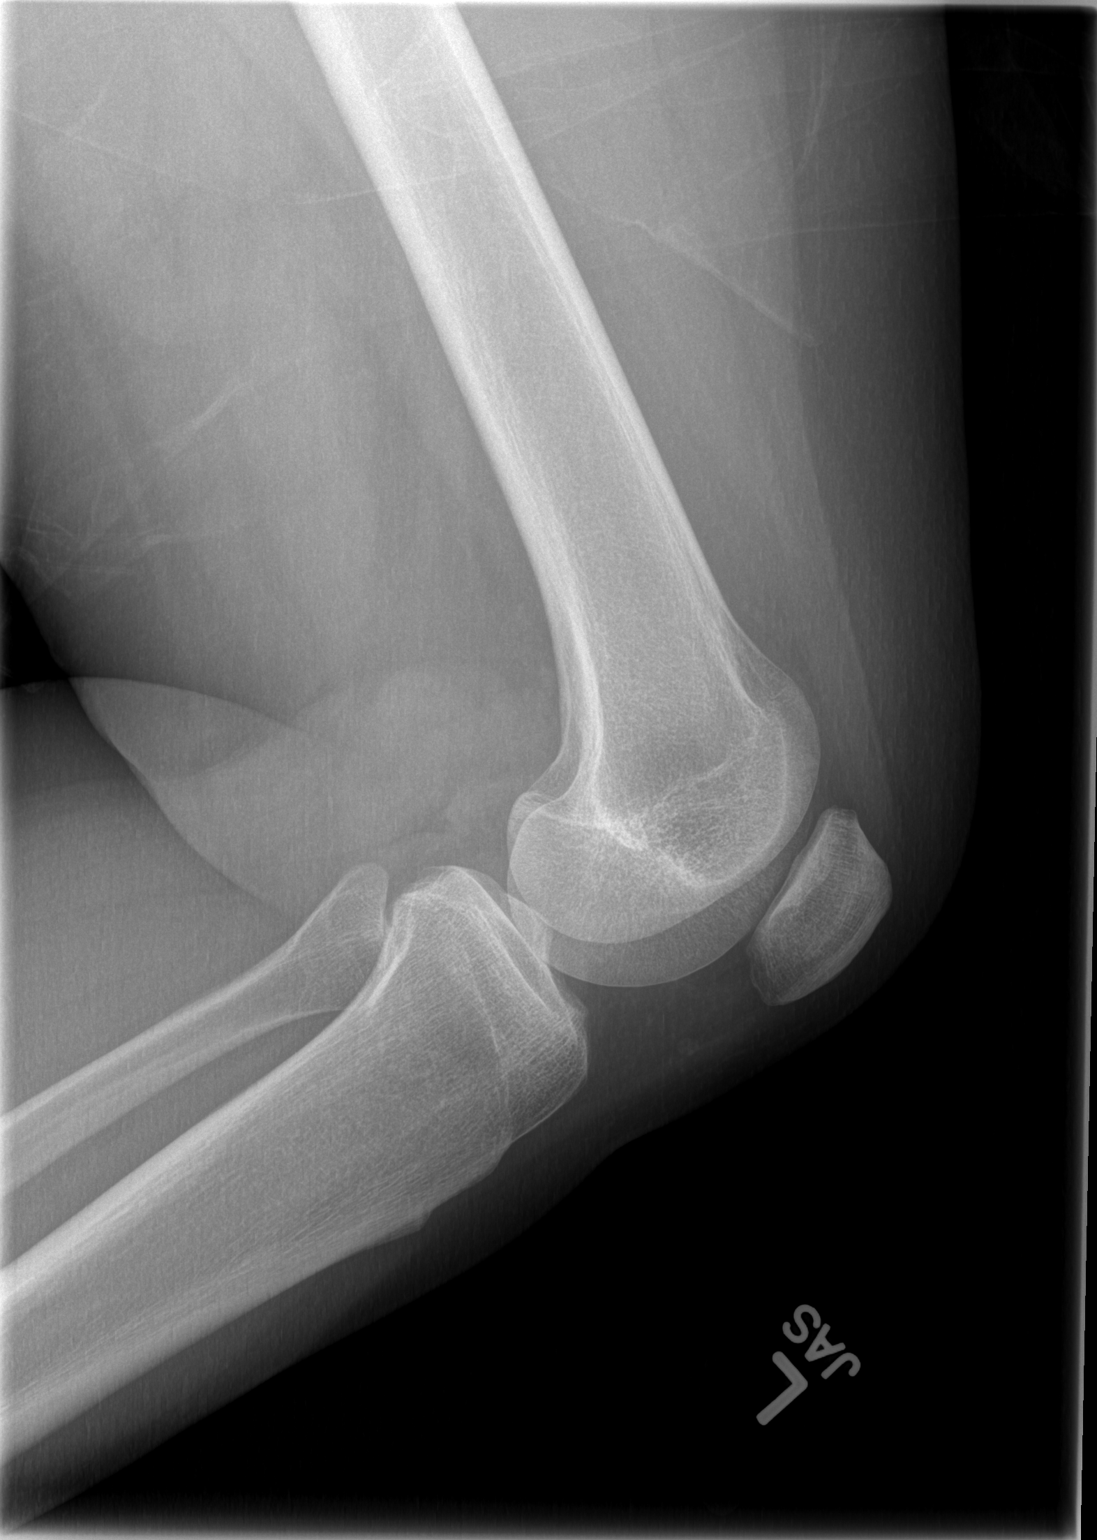

[2 of 2 positions shown; findings below may reference images not displayed]

FINDINGS: Joint spaces well maintained.  No effusion or fracture.
No arthropathy.
IMPRESSION: No acute findings.  No evidence of arthropathy.  No effusion.

## 2015-09-15 ENCOUNTER — Encounter (HOSPITAL_COMMUNITY): Payer: Self-pay | Admitting: Emergency Medicine

## 2015-09-15 ENCOUNTER — Ambulatory Visit (HOSPITAL_COMMUNITY)
Admission: EM | Admit: 2015-09-15 | Discharge: 2015-09-15 | Disposition: A | Discharge status: Home / Self Care | Payer: Medicare Other | Attending: Emergency Medicine | Admitting: Emergency Medicine

## 2015-09-15 ENCOUNTER — Ambulatory Visit (INDEPENDENT_AMBULATORY_CARE_PROVIDER_SITE_OTHER): Payer: Medicare Other

## 2015-09-15 DIAGNOSIS — M545 Low back pain, unspecified: Secondary | ICD-10-CM

## 2015-09-15 DIAGNOSIS — M25561 Pain in right knee: Secondary | ICD-10-CM

## 2015-09-15 MED ORDER — LIDOCAINE HCL (PF) 2 % IJ SOLN
INTRAMUSCULAR | Status: AC
  Filled 2015-09-15: qty 2

## 2015-09-15 MED ORDER — MELOXICAM 15 MG PO TABS: 15.0000 mg | ORAL_TABLET | Freq: Every day | ORAL | Status: DC

## 2015-09-15 MED ORDER — CYCLOBENZAPRINE HCL 10 MG PO TABS: 10.0000 mg | ORAL_TABLET | Freq: Every evening | ORAL | Status: DC | PRN

## 2015-09-15 MED ORDER — TRIAMCINOLONE ACETONIDE 40 MG/ML IJ SUSP
INTRAMUSCULAR | Status: AC
  Filled 2015-09-15: qty 1
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
## 2015-09-15 NOTE — Discharge Instructions (Signed)
You have some inflammation and spasm in the muscles of your back. Alternate ice and heat to the back muscles as often as you can. Take meloxicam daily for the next week, then as needed. Take Flexeril at bedtime for the next week, then as needed. This medicine will make you drowsy.  I suspect you have a small tear a meniscus in the knee. We did a steroid injection today. Please ice your knee tonight. The meloxicam will also help with the knee pain.  If things are not improving in the next 1-2 weeks, please follow-up with Dr. Magnus IvanBlackman in orthopedics.

## 2016-03-12 ENCOUNTER — Encounter (HOSPITAL_COMMUNITY): Payer: Self-pay

## 2016-03-12 ENCOUNTER — Inpatient Hospital Stay (HOSPITAL_COMMUNITY)
Admission: AD | Admit: 2016-03-12 | Discharge: 2016-03-12 | Disposition: A | Discharge status: Home / Self Care | Payer: Medicare Other | Source: Ambulatory Visit | Attending: Family Medicine | Admitting: Family Medicine

## 2016-03-12 DIAGNOSIS — B372 Candidiasis of skin and nail: Secondary | ICD-10-CM | POA: Insufficient documentation

## 2016-03-12 DIAGNOSIS — N9089 Other specified noninflammatory disorders of vulva and perineum: Secondary | ICD-10-CM | POA: Diagnosis not present

## 2016-03-12 DIAGNOSIS — Z113 Encounter for screening for infections with a predominantly sexual mode of transmission: Secondary | ICD-10-CM

## 2016-03-12 DIAGNOSIS — L089 Local infection of the skin and subcutaneous tissue, unspecified: Secondary | ICD-10-CM

## 2016-03-12 DIAGNOSIS — R070 Pain in throat: Secondary | ICD-10-CM | POA: Diagnosis not present

## 2016-03-12 DIAGNOSIS — Z3202 Encounter for pregnancy test, result negative: Secondary | ICD-10-CM | POA: Diagnosis not present

## 2016-03-12 DIAGNOSIS — F17210 Nicotine dependence, cigarettes, uncomplicated: Secondary | ICD-10-CM | POA: Insufficient documentation

## 2016-03-12 LAB — CBC
HEMATOCRIT: 37.6 % (ref 36.0–46.0)
Hemoglobin: 12.1 g/dL (ref 12.0–15.0)
MCH: 27.1 pg (ref 26.0–34.0)
MCHC: 32.2 g/dL (ref 30.0–36.0)
MCV: 84.3 fL (ref 78.0–100.0)
Platelets: 350 10*3/uL (ref 150–400)
RBC: 4.46 MIL/uL (ref 3.87–5.11)
RDW: 15.4 % (ref 11.5–15.5)
WBC: 17.4 10*3/uL — AB (ref 4.0–10.5)

## 2016-03-12 LAB — WET PREP, GENITAL
CLUE CELLS WET PREP: NONE SEEN
SPERM: NONE SEEN
TRICH WET PREP: NONE SEEN
WBC, Wet Prep HPF POC: NONE SEEN
Yeast Wet Prep HPF POC: NONE SEEN

## 2016-03-12 LAB — URINALYSIS, ROUTINE W REFLEX MICROSCOPIC
BILIRUBIN URINE: NEGATIVE
Glucose, UA: NEGATIVE mg/dL
Ketones, ur: NEGATIVE mg/dL
Leukocytes, UA: NEGATIVE
NITRITE: NEGATIVE
PROTEIN: NEGATIVE mg/dL
SPECIFIC GRAVITY, URINE: 1.005 (ref 1.005–1.030)
pH: 5 (ref 5.0–8.0)

## 2016-03-12 LAB — RAPID URINE DRUG SCREEN, HOSP PERFORMED
AMPHETAMINES: NOT DETECTED
Barbiturates: NOT DETECTED
Benzodiazepines: NOT DETECTED
COCAINE: NOT DETECTED
OPIATES: NOT DETECTED
TETRAHYDROCANNABINOL: NOT DETECTED

## 2016-03-12 LAB — RAPID STREP SCREEN (MED CTR MEBANE ONLY): STREPTOCOCCUS, GROUP A SCREEN (DIRECT): NEGATIVE

## 2016-03-12 LAB — POCT PREGNANCY, URINE: Preg Test, Ur: NEGATIVE

## 2016-03-12 LAB — RPR: RPR Ser Ql: NONREACTIVE

## 2016-03-12 LAB — HIV ANTIBODY (ROUTINE TESTING W REFLEX): HIV SCREEN 4TH GENERATION: NONREACTIVE

## 2016-03-12 MED ORDER — SULFAMETHOXAZOLE-TRIMETHOPRIM 800-160 MG PO TABS: 1.0000 | ORAL_TABLET | Freq: Two times a day (BID) | ORAL | 0 refills | Status: AC

## 2016-03-12 MED ORDER — NYSTATIN 100000 UNIT/GM EX CREA: TOPICAL_CREAM | CUTANEOUS | 0 refills | Status: DC

## 2016-03-12 NOTE — Discharge Instructions (Signed)
Safe Sex Safe sex is about reducing the risk of giving or getting a sexually transmitted disease (STD). STDs are spread through sexual contact involving the genitals, mouth, or rectum. Some STDs can be cured and others cannot. Safe sex can also prevent unintended pregnancies.  WHAT ARE SOME SAFE SEX PRACTICES?  Limit your sexual activity to only one partner who is having sex with only you.  Talk to your partner about his or her past partners, past STDs, and drug use.  Use a condom every time you have sexual intercourse. This includes vaginal, oral, and anal sexual activity. Both females and males should wear condoms during oral sex. Only use latex or polyurethane condoms and water-based lubricants. Using petroleum-based lubricants or oils to lubricate a condom will weaken the condom and increase the chance that it will break. The condom should be in place from the beginning to the end of sexual activity. Wearing a condom reduces, but does not completely eliminate, your risk of getting or giving an STD. STDs can be spread by contact with infected body fluids and skin.  Get vaccinated for hepatitis B and HPV.  Avoid alcohol and recreational drugs, which can affect your judgment. You may forget to use a condom or participate in high-risk sex.  For females, avoid douching after sexual intercourse. Douching can spread an infection farther into the reproductive tract.  Check your body for signs of sores, blisters, rashes, or unusual discharge. See your health care provider if you notice any of these signs.  Avoid sexual contact if you have symptoms of an infection or are being treated for an STD. If you or your partner has herpes, avoid sexual contact when blisters are present. Use condoms at all other times.  If you are at risk of being infected with HIV, it is recommended that you take a prescription medicine daily to prevent HIV infection. This is called pre-exposure prophylaxis (PrEP). You are  considered at risk if:  You are a man who has sex with other men (MSM).  You are a heterosexual man or woman who is sexually active with more than one partner.  You take drugs by injection.  You are sexually active with a partner who has HIV.  Talk with your health care provider about whether you are at high risk of being infected with HIV. If you choose to begin PrEP, you should first be tested for HIV. You should then be tested every 3 months for as long as you are taking PrEP.  See your health care provider for regular screenings, exams, and tests for other STDs. Before having sex with a new partner, each of you should be screened for STDs and should talk about the results with each other. WHAT ARE THE BENEFITS OF SAFE SEX?   There is less chance of getting or giving an STD.  You can prevent unwanted or unintended pregnancies.  By discussing safe sex concerns with your partner, you may increase feelings of intimacy, comfort, trust, and honesty between the two of you. This information is not intended to replace advice given to you by your health care provider. Make sure you discuss any questions you have with your health care provider. Document Released: 03/23/2004 Document Revised: 03/06/2014 Document Reviewed: 01/03/2015 Elsevier Interactive Patient Education  2017 [employer] Skin Yeast Infection Skin yeast infection is a condition in which there is an overgrowth of yeast (candida) that normally lives on the skin. This condition usually occurs in areas of the skin that  are constantly warm and moist, such as the armpits or the groin. What are the causes? This condition is caused by a change in the normal balance of the yeast and bacteria that live on the skin. What increases the risk? This condition is more likely to develop in:  People who are obese.  Pregnant women.  Women who take birth control pills.  People who have diabetes.  People who take antibiotic  medicines.  People who take steroid medicines.  People who are malnourished.  People who have a weak defense (immune) system.  People who are 5 years of age or older. What are the signs or symptoms? Symptoms of this condition include:  A red, swollen area of the skin.  Bumps on the skin.  Itchiness. How is this diagnosed? This condition is diagnosed with a medical history and physical exam. Your health care provider may check for yeast by taking light scrapings of the skin to be viewed under a microscope. How is this treated? This condition is treated with medicine. Medicines may be prescribed or be available over-the-counter. The medicines may be:  Taken by mouth (orally).  Applied as a cream. Follow these instructions at home:  Take or apply over-the-counter and prescription medicines only as told by your health care provider.  Eat more yogurt. This may help to keep your yeast infection from returning.  Maintain a healthy weight. If you need help losing weight, talk with your health care provider.  Keep your skin clean and dry.  If you have diabetes, keep your blood sugar under control. Contact a health care provider if:  Your symptoms go away and then return.  Your symptoms do not get better with treatment.  Your symptoms get worse.  Your rash spreads.  You have a fever or chills.  You have new symptoms.  You have new warmth or redness of your skin. This information is not intended to replace advice given to you by your health care provider. Make sure you discuss any questions you have with your health care provider. Document Released: 11/01/2010 Document Revised: 10/10/2015 Document Reviewed: 08/17/2014 Elsevier Interactive Patient Education  2017 [employer].

## 2016-03-12 NOTE — MAU Provider Note (Signed)
History     CSN: [id number]  Arrival date and time: 03/12/16 8295   First Provider Initiated Contact with Patient 03/12/16 [phone]      Chief Complaint  Patient presents with  . Possible Pregnancy  . std screen  . Sore Throat  . Hand Pain    right thumb   HPI Beth Berry is a 41 y.o. G0P0000 female who presents with multiple complaints & thinks she may be pregnant. LMP 12/30 & states it was a lighter flow than normal; has not taken test at home. Patient has multiple sexual partners, female & female; no contraception, occ condoms. Patient is concerned for STIs and wants to be tested for "everything".  -States she has had a sore throat for the last week after performing oral sex on another female. Throat pain is constant & rates 10/10. Has not treated pain. Denies drooling, fever/chills, congestion, cough, or body aches. Swallowing makes pain worse.  -Thumb pain for the last week. Does not recall injuring it. Reports pain at the end of her right thumb. Is insistent that her thumb is infected.  -Vaginal discharge for the last few weeks. Can't tell what discharge looks like but states it "smell like semen". Denies abdominal pain, vaginal bleeding, dyspareunia, or postcoital bleeding.    Past Medical History:  Diagnosis Date  . Bipolar 1 disorder (HCC)   . Contusion of great toe of right foot 06/11/2011  . Drug abuse   . Schizophrenia [facility])     Past Surgical History:  Procedure Laterality Date  . DENTAL SURGERY      Family History  Problem Relation Age of Onset  . Hypertension Mother   . Drug abuse Mother   . Drug abuse Father     Social History  Substance Use Topics  . Smoking status: Current Some Day Smoker    Packs/day: 1.50    Types: Cigarettes  . Smokeless tobacco: Never Used  . Alcohol use Yes     Comment: rarely    Allergies:  Allergies  Allergen Reactions  . Lamictal [Lamotrigine] Other (See Comments)    Caused "stevens johnson syndrome"    Prescriptions  Prior to Admission  Medication Sig Dispense Refill Last Dose  . acetaminophen (TYLENOL) 500 MG tablet Take 500 mg by mouth every 6 (six) hours as needed.   Past Week at Unknown time  . ARIPiprazole (ABILIFY) 30 MG tablet Take 30 mg by mouth daily.   Past Week at Unknown time  . clonazePAM (KLONOPIN) 1 MG tablet Take 1 mg by mouth daily as needed for anxiety.    03/11/2016 at Unknown time  . cyclobenzaprine (FLEXERIL) 10 MG tablet Take 1 tablet (10 mg total) by mouth at bedtime as needed for muscle spasms (back pain). 30 tablet 0   . hydrOXYzine (ATARAX/VISTARIL) 25 MG tablet Take 25 mg by mouth at bedtime as needed for itching or anxiety (sleep).    unknown  . ibuprofen (ADVIL,MOTRIN) 200 MG tablet Take 200-400 mg by mouth 2 (two) times daily as needed (menstrual cramps).   Past Month at Unknown time  . meloxicam (MOBIC) 15 MG tablet Take 1 tablet (15 mg total) by mouth daily. 30 tablet 0   . topiramate (TOPAMAX) 25 MG tablet Take 50 mg by mouth every morning.  2 09/15/2015 at Unknown time    Review of Systems  Constitutional: Positive for unexpected weight change (reports gaining 40# in the last year). Negative for chills and fever.  HENT: Positive for sore throat.  Negative for congestion, drooling, ear pain, mouth sores and trouble swallowing.   Gastrointestinal: Negative.  Negative for abdominal pain and constipation.  Genitourinary: Positive for frequency (increased frequency x 20 years) and vaginal discharge. Negative for dyspareunia, dysuria, genital sores, hematuria, vaginal bleeding and vaginal pain.  Skin: Positive for wound.   Physical Exam   Blood pressure 140/77, pulse 102, resp. rate 20, weight 202 lb 4 oz (91.7 kg), last menstrual period 02/26/2016, SpO2 100 %.  Physical Exam  Nursing note and vitals reviewed. Constitutional: She is oriented to person, place, and time. She appears well-developed and well-nourished. No distress.  HENT:  Head: Normocephalic and atraumatic.   Mouth/Throat: Posterior oropharyngeal edema present. No oropharyngeal exudate, posterior oropharyngeal erythema or tonsillar abscesses.  Eyes: Conjunctivae are normal. Right eye exhibits no discharge. Left eye exhibits no discharge. No scleral icterus.  Neck: Normal range of motion.  Respiratory: Effort normal. No respiratory distress.  Genitourinary: There is rash on the right labia. There is rash on the left labia.  Genitourinary Comments: Pt declined pelvic exam  Neurological: She is alert and oriented to person, place, and time.  Skin: Skin is warm and dry. She is not diaphoretic.  Distal end of right thumb -- fissure less than 0.5 cm long, erythematous  Psychiatric: She has a normal mood and affect. Her behavior is normal. Judgment and thought content normal.    MAU Course  Procedures Results for orders placed or performed during the hospital encounter of 03/12/16 (from the past 24 hour(s))  Urinalysis, Routine w reflex microscopic (not at [facility])     Status: Abnormal   Collection Time: 03/12/16  5:33 AM  Result Value Ref Range   Color, Urine STRAW (A) YELLOW   APPearance CLEAR CLEAR   Specific Gravity, Urine 1.005 1.005 - 1.030   pH 5.0 5.0 - 8.0   Glucose, UA NEGATIVE NEGATIVE mg/dL   Hgb urine dipstick MODERATE (A) NEGATIVE   Bilirubin Urine NEGATIVE NEGATIVE   Ketones, ur NEGATIVE NEGATIVE mg/dL   Protein, ur NEGATIVE NEGATIVE mg/dL   Nitrite NEGATIVE NEGATIVE   Leukocytes, UA NEGATIVE NEGATIVE   RBC / HPF 0-5 0 - 5 RBC/hpf   WBC, UA 0-5 0 - 5 WBC/hpf   Bacteria, UA RARE (A) NONE SEEN   Squamous Epithelial / LPF 0-5 (A) NONE SEEN   Mucous PRESENT   Rapid urine drug screen (hospital performed)     Status: None   Collection Time: 03/12/16  5:33 AM  Result Value Ref Range   Opiates NONE DETECTED NONE DETECTED   Cocaine NONE DETECTED NONE DETECTED   Benzodiazepines NONE DETECTED NONE DETECTED   Amphetamines NONE DETECTED NONE DETECTED   Tetrahydrocannabinol NONE DETECTED  NONE DETECTED   Barbiturates NONE DETECTED NONE DETECTED  Pregnancy, urine POC     Status: None   Collection Time: 03/12/16  5:50 AM  Result Value Ref Range   Preg Test, Ur NEGATIVE NEGATIVE  Wet prep, genital     Status: None   Collection Time: 03/12/16  7:00 AM  Result Value Ref Range   Yeast Wet Prep HPF POC NONE SEEN NONE SEEN   Trich, Wet Prep NONE SEEN NONE SEEN   Clue Cells Wet Prep HPF POC NONE SEEN NONE SEEN   WBC, Wet Prep HPF POC NONE SEEN NONE SEEN   Sperm NONE SEEN   CBC     Status: Abnormal   Collection Time: 03/12/16  7:15 AM  Result Value Ref Range   WBC  17.4 (H) 4.0 - 10.5 K/uL   RBC 4.46 3.87 - 5.11 MIL/uL   Hemoglobin 12.1 12.0 - 15.0 g/dL   HCT 37.6 09.636.0 - 04.546.0 %   M16.1CV 84.3 78.0 - 100.0 fL   MCH 27.1 26.0 - 34.0 pg   MCHC 32.2 30.0 - 36.0 g/dL   RDW 40.915.4 81.111.5 - 91.415.5 %   Platelets 350 150 - 400 K/uL    MDM UPT negative STD testing ordered per patient request Strep swab Assessment and Plan  A 1. Negative pregnancy test   2. Screen for STD (sexually transmitted disease)   3. Skin yeast infection   4. Infection of skin of finger    P: Discharge home Rx bactrim & nystatin F/u with PCP Discussed reasons to go to ER vs PCP GC/CT, strep swab, HIV, RPR, HSV pending  Judeth Hornrin Hollyanne Schloesser 03/12/2016, 6:33 AM

## 2016-03-12 NOTE — MAU Note (Signed)
LMP was month ago but was light and not sure if pregnant.  Clear discharge that stinks like sperm since Friday morning.  Check right thumb at nail , feels infected.  Throat is inflamed since January 31 st after oral sex with a female.  Wants to know why having vertigo for over a week.

## 2016-03-13 LAB — GC/CHLAMYDIA PROBE AMP (~~LOC~~) NOT AT ARMC
CHLAMYDIA, DNA PROBE: NEGATIVE
CHLAMYDIA, DNA PROBE: NEGATIVE
NEISSERIA GONORRHEA: NEGATIVE
Neisseria Gonorrhea: NEGATIVE

## 2016-03-14 LAB — CULTURE, GROUP A STREP (THRC)

## 2016-03-14 LAB — HSV(HERPES SMPLX)ABS-I+II(IGG+IGM)-BLD
HSV 1 GLYCOPROTEIN G AB, IGG: 13.4 {index} — AB (ref 0.00–0.90)
HSV 2 Glycoprotein G Ab, IgG: <0.91 index (ref 0.00–0.90)
HSVI/II COMB AB IGM: 1.45 ratio — AB (ref 0.00–0.90)

## 2017-05-19 ENCOUNTER — Emergency Department (HOSPITAL_COMMUNITY): Payer: Medicaid Other

## 2017-05-19 ENCOUNTER — Encounter (HOSPITAL_COMMUNITY): Payer: Self-pay | Admitting: Emergency Medicine

## 2017-05-19 ENCOUNTER — Other Ambulatory Visit: Payer: Self-pay

## 2017-05-19 ENCOUNTER — Emergency Department (HOSPITAL_COMMUNITY)
Admission: EM | Admit: 2017-05-19 | Discharge: 2017-05-19 | Disposition: A | Discharge status: Home / Self Care | Payer: Medicaid Other | Attending: Emergency Medicine | Admitting: Emergency Medicine

## 2017-05-19 DIAGNOSIS — L509 Urticaria, unspecified: Secondary | ICD-10-CM | POA: Diagnosis not present

## 2017-05-19 DIAGNOSIS — X500XXA Overexertion from strenuous movement or load, initial encounter: Secondary | ICD-10-CM | POA: Insufficient documentation

## 2017-05-19 DIAGNOSIS — S99911A Unspecified injury of right ankle, initial encounter: Secondary | ICD-10-CM | POA: Diagnosis present

## 2017-05-19 DIAGNOSIS — Y92481 Parking lot as the place of occurrence of the external cause: Secondary | ICD-10-CM | POA: Insufficient documentation

## 2017-05-19 DIAGNOSIS — Y9302 Activity, running: Secondary | ICD-10-CM | POA: Insufficient documentation

## 2017-05-19 DIAGNOSIS — Y999 Unspecified external cause status: Secondary | ICD-10-CM | POA: Insufficient documentation

## 2017-05-19 DIAGNOSIS — Z79899 Other long term (current) drug therapy: Secondary | ICD-10-CM | POA: Diagnosis not present

## 2017-05-19 DIAGNOSIS — F17210 Nicotine dependence, cigarettes, uncomplicated: Secondary | ICD-10-CM | POA: Insufficient documentation

## 2017-05-19 DIAGNOSIS — S93401A Sprain of unspecified ligament of right ankle, initial encounter: Secondary | ICD-10-CM | POA: Diagnosis not present

## 2017-05-19 MED ORDER — HYDROCORTISONE 1 % EX CREA: TOPICAL_CREAM | CUTANEOUS | 0 refills | Status: DC

## 2017-05-19 MED ORDER — DIPHENHYDRAMINE HCL 25 MG PO CAPS
50.0000 mg | ORAL_CAPSULE | Freq: Once | ORAL | Status: AC
Start: 2017-05-19 — End: 2017-05-19
  Administered 2017-05-19: 50 mg via ORAL
  Filled 2017-05-19: qty 2

## 2017-05-19 MED ORDER — DIPHENHYDRAMINE HCL 25 MG PO TABS: 50.0000 mg | ORAL_TABLET | Freq: Four times a day (QID) | ORAL | 0 refills | Status: DC | PRN

## 2017-05-19 MED ORDER — IBUPROFEN 800 MG PO TABS: 800.0000 mg | ORAL_TABLET | Freq: Three times a day (TID) | ORAL | 0 refills | Status: DC

## 2017-05-19 NOTE — ED Provider Notes (Signed)
[facility] EMERGENCY DEPARTMENT Provider Note   CSN: [id number] Arrival date & time: 05/19/17  1823     History   Chief Complaint Chief Complaint  Patient presents with  . Leg Pain  . Rash    HPI Beth Berry is a 42 y.o. female presenting for evaluation of R ankle and knee pain.   Pt states she was jogging acros the parking lot when she twisted her R ankle. She had acute onset R ankle and knee pain. She states the pain is constant, worse with movement, palpation, and weight-bearing. She has a h/o torn meniscus of the R knee, not operated upon. She has not taken anything for pain including tylenol or ibuprofen, nothing has made it better. She is not on blood thinners. She states she did not fall or hit her head. No pain elsewhere. No numbness/tingling. She does not have an orthopedic doctor. She developed an urticarial rash of bilateral legs after the injury, which she states is from pain and stress. She denies CP, SOB, or tongue/lip/throat swelling.  HPI  Past Medical History:  Diagnosis Date  . Bipolar 1 disorder (HCC)   . Contusion of great toe of right foot 06/11/2011  . Drug abuse (HCC)   . Schizophrenia (HCC)     There are no active problems to display for this patient.   Past Surgical History:  Procedure Laterality Date  . DENTAL SURGERY       OB History    Gravida  0   Para  0   Term  0   Preterm  0   AB  0   Living  0     SAB  0   TAB  0   Ectopic  0   Multiple  0   Live Births  0            Home Medications    Prior to Admission medications   Medication Sig Start Date End Date Taking? Authorizing Provider  acetaminophen (TYLENOL) 500 MG tablet Take 500 mg by mouth every 6 (six) hours as needed.    [provider]  ARIPiprazole (ABILIFY) 30 MG tablet Take 30 mg by mouth daily.    [provider]  clonazePAM (KLONOPIN) 1 MG tablet Take 1 mg by mouth daily as needed for anxiety.     [provider]  cyclobenzaprine (FLEXERIL) 10 MG tablet Take 1 tablet (10 mg total) by mouth at bedtime as needed for muscle spasms (back pain). 09/15/15   Charm Rings, MD  diphenhydrAMINE (BENADRYL) 25 MG tablet Take 2 tablets (50 mg total) by mouth every 6 (six) hours as needed for itching. 05/19/17   Raylea Adcox, PA-C  hydrocortisone cream 1 % Apply to affected area 2 times daily as needed for itching 05/19/17   Tamre Cass, PA-C  hydrOXYzine (ATARAX/VISTARIL) 25 MG tablet Take 25 mg by mouth at bedtime as needed for itching or anxiety (sleep).     [provider]  ibuprofen (ADVIL,MOTRIN) 800 MG tablet Take 1 tablet (800 mg total) by mouth 3 (three) times daily with meals. 05/19/17   Juluis Fitzsimmons, PA-C  meloxicam (MOBIC) 15 MG tablet Take 1 tablet (15 mg total) by mouth daily. 09/15/15   Charm Rings, MD  nystatin cream (MYCOSTATIN) Apply to affected area 2 times daily 03/12/16   Judeth Horn, NP  topiramate (TOPAMAX) 25 MG tablet Take 50 mg by mouth every morning. 12/31/14   [provider]  Family History Family History  Problem Relation Age of Onset  . Hypertension Mother   . Drug abuse Mother   . Drug abuse Father     Social History Social History   Tobacco Use  . Smoking status: Current Some Day Smoker    Packs/day: 1.50    Types: Cigarettes  . Smokeless tobacco: Never Used  Substance Use Topics  . Alcohol use: Yes    Comment: rarely  . Drug use: Yes    Types: Heroin, MDMA (Ecstacy), Cocaine    Comment: none now, hx of marijuana, narcotic, heroin, cocaine- last use 3 years     Allergies   Lamictal [lamotrigine]   Review of Systems Review of Systems  Musculoskeletal: Positive for arthralgias and joint swelling. Negative for back pain, myalgias and neck pain.  Skin: Positive for rash.  Neurological: Negative for numbness.  Hematological: Does not bruise/bleed easily.     Physical Exam Updated Vital Signs BP 131/86 (BP  Location: Left Arm)   Pulse 94   Temp 98.1 F (36.7 C) (Oral)   Resp 16   LMP 05/18/2017 Comment: pt shielded  SpO2 100%   Physical Exam  Constitutional: She is oriented to person, place, and time. She appears well-developed and well-nourished. No distress.  HENT:  Head: Normocephalic and atraumatic.  Mouth/Throat: Uvula is midline, oropharynx is clear and moist and mucous membranes are normal.  No tongue, lip or throat swelling. No stridor or respiratory distress. Handling secretions easily.   Eyes: EOM are normal.  Neck: Normal range of motion.  Cardiovascular: Normal rate, regular rhythm and intact distal pulses.  Pulmonary/Chest: Effort normal and breath sounds normal. No respiratory distress. She has no wheezes.  Speaking in full sentences. Clear lung sounds  Abdominal: She exhibits no distension.  Musculoskeletal: She exhibits edema and tenderness.  Swelling of lateral malleolus of R ankle. TTP of R lateral malleolus and distal 5th metatarsal. TTP of anterior R knee without obvious swelling. No lacerations noted. Pedal pulses intact bilaterally. Sensation intact bilaterally. Color and warmth equal bilaterally. Pt will not walk due to pain  Neurological: She is alert and oriented to person, place, and time. No sensory deficit.  Skin: Skin is warm. Rash noted.  Urticarial rash of bilateral thighs. No rash noted elsewhere  Psychiatric: She has a normal mood and affect.  Nursing note and vitals reviewed.    ED Treatments / Results  Labs (all labs ordered are listed, but only abnormal results are displayed) Labs Reviewed - No data to display  EKG None  Radiology Dg Ankle Complete Right  Result Date: 05/19/2017 CLINICAL DATA:  Pain after fall EXAM: RIGHT ANKLE - COMPLETE 3+ VIEW COMPARISON:  None. FINDINGS: Soft tissue swelling, particularly laterally. No evidence of fracture. A few tiny calcifications distal to the fibula consistent with an avulsion injury, likely remote.  IMPRESSION: No acute fracture identified.  Soft tissue swelling noted. Electronically Signed   By: Gerome Samavid  Williams III M.D   On: 05/19/2017 19:29   Dg Knee Complete 4 Views Right  Result Date: 05/19/2017 CLINICAL DATA:  Pain after fall EXAM: RIGHT KNEE - COMPLETE 4+ VIEW COMPARISON:  None. FINDINGS: No evidence of fracture, dislocation, or joint effusion. No evidence of arthropathy or other focal bone abnormality. Soft tissues are unremarkable. IMPRESSION: Negative. Electronically Signed   By: Gerome Samavid  Williams III M.D   On: 05/19/2017 19:31   Dg Foot Complete Right  Result Date: 05/19/2017 CLINICAL DATA:  Pain after fall EXAM: RIGHT FOOT COMPLETE -  3+ VIEW COMPARISON:  None. FINDINGS: There is an age-indeterminate fracture through the distal fifth phalanx. No other fractures are identified. IMPRESSION: Age-indeterminate fracture through the tuft of the distal fifth phalanx. Electronically Signed   By: Gerome Sam III M.D   On: 05/19/2017 19:30    Procedures Procedures (including critical care time)  Medications Ordered in ED Medications  diphenhydrAMINE (BENADRYL) capsule 50 mg (50 mg Oral Given 05/19/17 1905)     Initial Impression / Assessment and Plan / ED Course  I have reviewed the triage vital signs and the nursing notes.  Pertinent labs & imaging results that were available during my care of the patient were reviewed by me and considered in my medical decision making (see chart for details).     Pt presenting for evaluation of R ankle and knee pain and bilateral urticarial rash. Physical exam shows pt with no neurovascular deficits. urticarial rash on bilateral legs without sings of respiratory distress. Will obtain xray of foot, ankle, and knee. Benadryl and ibuprofen for sx control.   Xray viewed and interpreted by me, doubt new fx or dislocation. Itching improved with benadryl. Rash is not spreading. Discussed findings with pt. Discussed use of aso brace, knee sleeve and  crutches. instructed on RICE therapy. F/u with pcp in 1wk as needed. Ortho follow up given as needed. At this time, pt appears safe for d/c. Return precautions given. Pt states she understands and agrees to plan.   Final Clinical Impressions(s) / ED Diagnoses   Final diagnoses:  Sprain of right ankle, unspecified ligament, initial encounter  Urticaria    ED Discharge Orders        Ordered    ibuprofen (ADVIL,MOTRIN) 800 MG tablet  3 times daily with meals     05/19/17 2008    diphenhydrAMINE (BENADRYL) 25 MG tablet  Every 6 hours PRN     05/19/17 2008    hydrocortisone cream 1 %     05/19/17 [address], PA-C 05/19/17 2103    Rolan Bucco, MD 05/19/17 2313

## 2017-05-19 NOTE — Progress Notes (Signed)
2Orthopedic Tech Progress Note Patient Details:  Beth Berry [birth year] [id number]  Ortho Devices Type of Ortho Device: ASO, Knee Sleeve, Crutches Ortho Device/Splint Location: rle  Ortho Device/Splint Interventions: Ordered, Application, Adjustment   Post Interventions Patient Tolerated: Well Instructions Provided: Care of device, Adjustment of device   Trinna PostMartinez, Orelia Brandstetter J 05/19/2017, 11:56 PM

## 2017-05-19 NOTE — Discharge Instructions (Addendum)
Take ibuprofen 3 times a day with meals.  Do not take other anti-inflammatories at the same time open (Advil, Motrin, naproxen, Aleve). You may supplement with Tylenol if you need further pain control. Use ice packs for 20 minutes at a time, at least 4 times a day.  Try to rest and elevate your ankle when able.  Use ASO brace, knee sleeve, and crutches as needed.  Use benadryl and hydrocortisone cream as needed for itching.  Follow up with your primary care doctor in 1 week if your pain is not improving. Return to the ER if your foot becomes numb, your foot turns white, or you have any new or concerning symptoms.

## 2017-05-19 NOTE — ED Triage Notes (Signed)
Patient presents to ED for assessment of right ankle pain/swelling, right knee pain (with a hx of torn meniscus) and "while I'm here" a rash to her upper thigh.  Patient states she tripped and fell dodging a car in the [employer] parking lot.

## 2017-05-19 NOTE — ED Notes (Signed)
Waiting for ortho 

## 2018-04-06 ENCOUNTER — Encounter (HOSPITAL_COMMUNITY): Payer: Self-pay | Admitting: *Deleted

## 2018-04-06 ENCOUNTER — Other Ambulatory Visit: Payer: Self-pay

## 2018-04-06 ENCOUNTER — Emergency Department (HOSPITAL_COMMUNITY)
Admission: EM | Admit: 2018-04-06 | Discharge: 2018-04-09 | Disposition: A | Discharge status: Other Acute Inpatient Hospital | Payer: Medicaid Other | Attending: Emergency Medicine | Admitting: Emergency Medicine

## 2018-04-06 DIAGNOSIS — R4689 Other symptoms and signs involving appearance and behavior: Secondary | ICD-10-CM | POA: Diagnosis present

## 2018-04-06 DIAGNOSIS — F3113 Bipolar disorder, current episode manic without psychotic features, severe: Secondary | ICD-10-CM

## 2018-04-06 DIAGNOSIS — F17210 Nicotine dependence, cigarettes, uncomplicated: Secondary | ICD-10-CM | POA: Insufficient documentation

## 2018-04-06 DIAGNOSIS — Z79899 Other long term (current) drug therapy: Secondary | ICD-10-CM | POA: Insufficient documentation

## 2018-04-06 DIAGNOSIS — R21 Rash and other nonspecific skin eruption: Secondary | ICD-10-CM | POA: Diagnosis not present

## 2018-04-06 DIAGNOSIS — F29 Unspecified psychosis not due to a substance or known physiological condition: Secondary | ICD-10-CM | POA: Diagnosis not present

## 2018-04-06 NOTE — ED Notes (Addendum)
Pt is having flight of ideas, disorganized thought.  Pt is alert, disoriented to date but oriented to month & place.

## 2018-04-06 NOTE — ED Notes (Signed)
Bed: [facility] Expected date:  Expected time:  Means of arrival:  Comments: 

## 2018-04-06 NOTE — ED Triage Notes (Signed)
Pt brought in by [facility], pt found by [employer] in her car, pinpoint pupils, is confused, rambling.

## 2018-04-07 DIAGNOSIS — F3113 Bipolar disorder, current episode manic without psychotic features, severe: Secondary | ICD-10-CM | POA: Diagnosis present

## 2018-04-07 LAB — RAPID URINE DRUG SCREEN, HOSP PERFORMED
Amphetamines: NOT DETECTED
BARBITURATES: NOT DETECTED
BENZODIAZEPINES: NOT DETECTED
COCAINE: NOT DETECTED
OPIATES: NOT DETECTED
Tetrahydrocannabinol: NOT DETECTED

## 2018-04-07 LAB — CBC
HCT: 37.1 % (ref 36.0–46.0)
Hemoglobin: 11.6 g/dL — ABNORMAL LOW (ref 12.0–15.0)
MCH: 27.6 pg (ref 26.0–34.0)
MCHC: 31.3 g/dL (ref 30.0–36.0)
MCV: 88.3 fL (ref 80.0–100.0)
NRBC: 0 % (ref 0.0–0.2)
Platelets: 286 10*3/uL (ref 150–400)
RBC: 4.2 MIL/uL (ref 3.87–5.11)
RDW: 15 % (ref 11.5–15.5)
WBC: 14.8 10*3/uL — AB (ref 4.0–10.5)

## 2018-04-07 LAB — COMPREHENSIVE METABOLIC PANEL
ALBUMIN: 4 g/dL (ref 3.5–5.0)
ALT: 17 U/L (ref 0–44)
ANION GAP: 11 (ref 5–15)
AST: 25 U/L (ref 15–41)
Alkaline Phosphatase: 59 U/L (ref 38–126)
BUN: 7 mg/dL (ref 6–20)
CHLORIDE: 104 mmol/L (ref 98–111)
CO2: 22 mmol/L (ref 22–32)
Calcium: 8.6 mg/dL — ABNORMAL LOW (ref 8.9–10.3)
Creatinine, Ser: 0.66 mg/dL (ref 0.44–1.00)
GFR calc Af Amer: >60 mL/min (ref >60)
GFR calc non Af Amer: >60 mL/min (ref >60)
Glucose, Bld: 86 mg/dL (ref 70–99)
POTASSIUM: 3 mmol/L — AB (ref 3.5–5.1)
SODIUM: 137 mmol/L (ref 135–145)
TOTAL PROTEIN: 7.9 g/dL (ref 6.5–8.1)
Total Bilirubin: 0.5 mg/dL (ref 0.3–1.2)

## 2018-04-07 LAB — I-STAT BETA HCG BLOOD, ED (MC, WL, AP ONLY): I-stat hCG, quantitative: <5 m[IU]/mL (ref <5)

## 2018-04-07 LAB — SALICYLATE LEVEL: Salicylate Lvl: <7 mg/dL (ref 2.8–30.0)

## 2018-04-07 LAB — ACETAMINOPHEN LEVEL

## 2018-04-07 LAB — ETHANOL: Alcohol, Ethyl (B): <10 mg/dL (ref <10)

## 2018-04-07 MED ORDER — STERILE WATER FOR INJECTION IJ SOLN
INTRAMUSCULAR | Status: AC
  Filled 2018-04-07: qty 10

## 2018-04-07 MED ORDER — OLANZAPINE 10 MG IM SOLR
10.0000 mg | Freq: Once | INTRAMUSCULAR | Status: AC
  Administered 2018-04-07: 10 mg via INTRAMUSCULAR
  Filled 2018-04-07: qty 10

## 2018-04-07 MED ORDER — DIPHENHYDRAMINE HCL 50 MG/ML IJ SOLN
50.0000 mg | Freq: Once | INTRAMUSCULAR | Status: AC
  Administered 2018-04-07: 50 mg via INTRAMUSCULAR
  Filled 2018-04-07: qty 1

## 2018-04-07 MED ORDER — STERILE WATER FOR INJECTION IJ SOLN
INTRAMUSCULAR | Status: AC
  Administered 2018-04-07: 10 mL
  Filled 2018-04-07: qty 10

## 2018-04-07 MED ORDER — ZIPRASIDONE MESYLATE 20 MG IM SOLR
20.0000 mg | Freq: Once | INTRAMUSCULAR | Status: AC
  Administered 2018-04-07: 20 mg via INTRAMUSCULAR
  Filled 2018-04-07: qty 20

## 2018-04-07 MED ORDER — LORAZEPAM 2 MG/ML IJ SOLN
1.0000 mg | Freq: Once | INTRAMUSCULAR | Status: AC
  Administered 2018-04-07: 1 mg via INTRAMUSCULAR
  Filled 2018-04-07: qty 1

## 2018-04-07 MED ORDER — LORAZEPAM 1 MG PO TABS: 1.0000 mg | ORAL_TABLET | Freq: Once | ORAL | Status: DC

## 2018-04-07 MED ORDER — ASENAPINE MALEATE 5 MG SL SUBL: 10.0000 mg | SUBLINGUAL_TABLET | Freq: Two times a day (BID) | SUBLINGUAL | Status: DC

## 2018-04-07 MED ORDER — ASENAPINE MALEATE 5 MG SL SUBL
10.0000 mg | SUBLINGUAL_TABLET | Freq: Two times a day (BID) | SUBLINGUAL | Status: DC
  Administered 2018-04-07 – 2018-04-09 (×4): 10 mg via SUBLINGUAL
  Filled 2018-04-07 (×4): qty 2

## 2018-04-07 NOTE — ED Notes (Addendum)
Pt woke up and became very aggressive.  PT was standing on bed and when asked to sit down the pt tried

## 2018-04-07 NOTE — [facility] Counselor (Signed)
Pt reported, she did not want clinician to contact anyone (to obtain collateral information) as she will not be in the hospital long enough.   Redmond Pulling, MS, [facility], [facility] Triage Specialist [phone]

## 2018-04-07 NOTE — ED Provider Notes (Addendum)
TIME SEEN: 12:14 AM  CHIEF COMPLAINT: Medical clearance  HPI: Patient is a 43 year old female with history of schizophrenia, bipolar disorder, substance abuse who presents to the emergency department for medical clearance.  Patient unable to provide clear history.  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  She denies being suicidal homicidal.  States "it is February and I love everybody".  Denies any hallucinations.  Denies drug or alcohol use.  When asked if she is having pain she states yes but is unable to tell me where she is having pain.  ROS: Level 5 caveat for psychosis  PAST MEDICAL HISTORY/PAST SURGICAL HISTORY:  Past Medical History:  Diagnosis Date  . Bipolar 1 disorder (HCC)   . Contusion of great toe of right foot 06/11/2011  . Drug abuse (HCC)   . Schizophrenia (HCC)     MEDICATIONS:  Prior to Admission medications   Medication Sig Start Date End Date Taking? Authorizing Provider  acetaminophen (TYLENOL) 500 MG tablet Take 500 mg by mouth every 6 (six) hours as needed.    [provider]  ARIPiprazole (ABILIFY) 30 MG tablet Take 30 mg by mouth daily.    [provider]  clonazePAM (KLONOPIN) 1 MG tablet Take 1 mg by mouth daily as needed for anxiety.     [provider]  cyclobenzaprine (FLEXERIL) 10 MG tablet Take 1 tablet (10 mg total) by mouth at bedtime as needed for muscle spasms (back pain). 09/15/15   Charm RingsHonig, Erin J, MD  diphenhydrAMINE (BENADRYL) 25 MG tablet Take 2 tablets (50 mg total) by mouth every 6 (six) hours as needed for itching. 05/19/17   Caccavale, Sophia, PA-C  hydrocortisone cream 1 % Apply to affected area 2 times daily as needed for itching 05/19/17   Caccavale, Sophia, PA-C  hydrOXYzine (ATARAX/VISTARIL) 25 MG tablet Take 25 mg by mouth at bedtime as needed for itching or anxiety (sleep).     [provider]  ibuprofen (ADVIL,MOTRIN) 800 MG tablet Take 1 tablet (800 mg  total) by mouth 3 (three) times daily with meals. 05/19/17   Caccavale, Sophia, PA-C  meloxicam (MOBIC) 15 MG tablet Take 1 tablet (15 mg total) by mouth daily. 09/15/15   Charm RingsHonig, Erin J, MD  nystatin cream (MYCOSTATIN) Apply to affected area 2 times daily 03/12/16   Judeth HornLawrence, Erin, NP  topiramate (TOPAMAX) 25 MG tablet Take 50 mg by mouth every morning. 12/31/14   [provider]    ALLERGIES:  Allergies  Allergen Reactions  . Lamictal [Lamotrigine] Other (See Comments)    Caused "stevens johnson syndrome"    SOCIAL HISTORY:  Social History   Tobacco Use  . Smoking status: Current Some Day Smoker    Packs/day: 1.50    Types: Cigarettes  . Smokeless tobacco: Never Used  Substance Use Topics  . Alcohol use: Yes    Comment: rarely    FAMILY HISTORY: Family History  Problem Relation Age of Onset  . Hypertension Mother   . Drug abuse Mother   . Drug abuse Father     EXAM: BP (!) 157/90 (BP Location: Left Arm)   Pulse 93   Temp 98.1 F (36.7 C) (Oral)   Resp 16   Ht 5\' 7"  (1.702 m)   Wt 104.3 kg   LMP  (Approximate) Comment: Pt stated "It's been a while."  SpO2 98%   BMI 36.02 kg/m  CONSTITUTIONAL: Alert and oriented and responds to some  questions appropriately but appears manic, psychotic, difficult to redirect HEAD: Normocephalic EYES: Conjunctivae clear, pupils appear equal, EOMI ENT: normal nose; moist mucous membranes NECK: Supple, no meningismus, no nuchal rigidity, no LAD  CARD: RRR; S1 and S2 appreciated; no murmurs, no clicks, no rubs, no gallops RESP: Normal chest excursion without splinting or tachypnea; breath sounds clear and equal bilaterally; no wheezes, no rhonchi, no rales, no hypoxia or respiratory distress, speaking full sentences ABD/GI: Normal bowel sounds; non-distended; soft, non-tender, no rebound, no guarding, no peritoneal signs, no hepatosplenomegaly BACK:  The back appears normal and is non-tender to palpation, there is no CVA  tenderness EXT: Normal ROM in all joints; non-tender to palpation; no edema; normal capillary refill; no cyanosis, no calf tenderness or swelling    SKIN: Normal color for age and race; warm; no rash NEURO: Moves all extremities equally PSYCH: Patient has tangential thought process, poor insight, rapid and pressured speech.  Denies SI, HI or hallucinations.  Appears psychotic, manic.  MEDICAL DECISION MAKING: Patient here with psychosis, mania.  Has been admitted to psychiatric hospital for the same previously.  I feel she is a danger to herself and others.  She has poor insight.  Difficult to obtain a history from patient given her mania.  Will IVC patient.  Will obtain screening labs, urine.  Will consult TTS.  ED PROGRESS: Patient seen by TTS.  I have recommended inpatient psychiatric treatment.  They will work on placement.   4:00 AM  Patient becoming increasingly agitated.  She is taking off her clothes in the hallway.  Security at bedside.  IVC paperwork has been complete.  Patient asking to leave and states "I work at [employer] and I need to get the fuck out of here.  I am a medical professional".  Discussed with patient that she will be under involuntary commitment and she states "under who's supervision".  Discussed with patient that it was my recommendation that she be involuntarily committed and I feel she needs psychiatric treatment.  She states "where? in fucking [place]".  Difficult to redirect patient.  She is cursing at staff.  Will be given IM Geodon, Ativan for agitation for patient and staff's safety.   5:45 AM  Pt now resting comfortably.  On continuous pulse oximetry.   I reviewed all nursing notes, vitals, pertinent previous records, EKGs, lab and urine results, imaging (as available).    , Layla Maw, DO 04/07/18 [phone]   CRITICAL CARE Performed by: Rochele Raring   Total critical care time: 55 minutes  Critical care time was exclusive of separately billable  procedures and treating other patients.  Critical care was necessary to treat or prevent imminent or life-threatening deterioration.  Critical care was time spent personally by me on the following activities: development of treatment plan with patient and/or surrogate as well as nursing, discussions with consultants, evaluation of patient's response to treatment, examination of patient, obtaining history from patient or surrogate, ordering and performing treatments and interventions, ordering and review of laboratory studies, ordering and review of radiographic studies, pulse oximetry and re-evaluation of patient's condition.     , Layla Maw, DO 04/22/18 [phone]

## 2018-04-07 NOTE — ED Notes (Signed)
Patient made aware we need urine sample. Will call out when ready to void.

## 2018-04-07 NOTE — ED Notes (Signed)
Patient is being escorted to [facility].

## 2018-04-07 NOTE — ED Notes (Signed)
???    Patient meets inpatient criteria. 

## 2018-04-07 NOTE — ED Notes (Signed)
Bed: HTX77 Expected date:  Expected time:  Means of arrival:  Comments: Greer Pickerel

## 2018-04-07 NOTE — ED Notes (Signed)
Patient is being changed out of [place]andy paper scrubs into new Burgandy paper scrubs. Security is standing by to escort patient back to [facility].
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
## 2018-04-07 NOTE — ED Notes (Signed)
Pt oriented to room and unit.  Pt is very manic and hyperverbal.   Pt was confused at first as to where she worked but since has cleared up a bit.  Pt is pleasant and cooperative at this time and denies S/I and H/I and AVH.  15 minute checks and video monitoring in place.

## 2018-04-07 NOTE — ED Notes (Signed)
Patient took off pulse ox and threw it at [employer]. She then took her scrub top off and started walking around shirtless. She then threw both socks at the nurses station. She started yelling and cussing, pacing around the ED.

## 2018-04-07 NOTE — ED Notes (Signed)
Pt will not follow direction.  She slammed the phone against the wall.  She will not stay clothed and she is very bizarre.  She has pushed emergency button multiple times and when staff tried talking to her about it she swung at staff.

## 2018-04-08 DIAGNOSIS — F3113 Bipolar disorder, current episode manic without psychotic features, severe: Secondary | ICD-10-CM

## 2018-04-08 LAB — LIPID PANEL
CHOLESTEROL: 143 mg/dL (ref 0–200)
HDL: 58 mg/dL (ref >40)
LDL Cholesterol: 73 mg/dL (ref 0–99)
Total CHOL/HDL Ratio: 2.5 RATIO
Triglycerides: 61 mg/dL (ref <150)
VLDL: 12 mg/dL (ref 0–40)

## 2018-04-08 LAB — TSH: TSH: 0.613 u[IU]/mL (ref 0.350–4.500)

## 2018-04-08 MED ORDER — POTASSIUM CHLORIDE CRYS ER 20 MEQ PO TBCR
40.0000 meq | EXTENDED_RELEASE_TABLET | Freq: Once | ORAL | Status: AC
  Administered 2018-04-08: 40 meq via ORAL
  Filled 2018-04-08: qty 2

## 2018-04-08 MED ORDER — DIPHENHYDRAMINE HCL 50 MG/ML IJ SOLN: 50.0000 mg | Freq: Once | INTRAMUSCULAR | Status: DC

## 2018-04-08 MED ORDER — LORAZEPAM 2 MG/ML IJ SOLN: 2.0000 mg | Freq: Once | INTRAMUSCULAR | Status: DC | PRN

## 2018-04-08 MED ORDER — ACETAMINOPHEN 325 MG PO TABS
650.0000 mg | ORAL_TABLET | ORAL | Status: DC | PRN
  Administered 2018-04-08: 650 mg via ORAL
  Filled 2018-04-08: qty 2

## 2018-04-08 MED ORDER — ZIPRASIDONE MESYLATE 20 MG IM SOLR: 20.0000 mg | Freq: Once | INTRAMUSCULAR | Status: DC

## 2018-04-08 MED ORDER — DIPHENHYDRAMINE HCL 50 MG/ML IJ SOLN: 50.0000 mg | Freq: Once | INTRAMUSCULAR | Status: DC | PRN

## 2018-04-08 MED ORDER — ZIPRASIDONE MESYLATE 20 MG IM SOLR: 20.0000 mg | Freq: Once | INTRAMUSCULAR | Status: DC | PRN

## 2018-04-08 MED ORDER — LORAZEPAM 2 MG/ML IJ SOLN: 2.0000 mg | Freq: Once | INTRAMUSCULAR | Status: DC

## 2018-04-08 MED ORDER — DIPHENHYDRAMINE HCL 25 MG PO CAPS
50.0000 mg | ORAL_CAPSULE | Freq: Four times a day (QID) | ORAL | Status: DC | PRN
  Administered 2018-04-08 – 2018-04-09 (×2): 50 mg via ORAL
  Filled 2018-04-08 (×2): qty 2

## 2018-04-08 NOTE — ED Notes (Signed)
PSYCH DO Provider AND TEAM at bedside.  

## 2018-04-08 NOTE — ED Notes (Signed)
PT IS IVC/ PAPERS PRESENT  04/07/2018 EDP KRISTEN WARD  GOOD FOR 7 DAYS

## 2018-04-08 NOTE — Progress Notes (Signed)
Received Beth Berry this PM at the change of shift, she is at the nurses station making requests and on the at intervals making calls. She is loud and having a difficult time following directions. She pushed her chair out in the hallway, took her mattress off the bed and sitting on her bed frame.Shortly thereafter she was standing in the hallway talking on the phone and nude from the waist up. She was medicated and slept for approximately 2 hours. She got up to use the bathroom and came out nude from the waist down. She returned to her room and sitting on the side of the bed at 0100 hrs and nude from the waist down. She took a shower at 0214 hrs.
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
## 2018-04-08 NOTE — BH Assessment (Signed)
Patient has been accepted to [facility].  Accepting physician is Dr. Viviano Simas.  Attending Physician will be Dr. Toni Amend.  Patient has been assigned to room 316, by [facility] Charge Nurse [place] F.  Call report to [phone].  Representative/Transfer Coordinator is Warden/ranger Patient pre-admitted by [facility] Patient Access Irene Pap)  [facility] Staff Thayer Ohm, [facility]) made aware of acceptance.  Bed will be available tomorrow (04/09/2018)

## 2018-04-08 NOTE — BH Assessment (Addendum)
[facility] Assessment Progress Note  Per Juanetta Beets, DO, this pt requires psychiatric hospitalization at this time.  Pt presents under IVC initiated by EDP Kristen Ward, DO.  The following facilities have been contacted to seek placement for this pt, with results as noted:  Beds available, information sent, decision pending:   [facility]   Declined:  [facility] (due to pt acuity; will reconsider) [facility] (due to pt acuity)    Doylene Canning, MA Behavioral Health Coordinator [phone]

## 2018-04-08 NOTE — ED Notes (Signed)
Per [facility] for tomorrow.

## 2018-04-08 NOTE — Consult Note (Addendum)
[facility] Face-to-Face Psychiatry Consult   Reason for Consult:  Mania  Referring Physician:  EDP Patient Identification: Beth Berry MRN:  [id number] Principal Diagnosis: Bipolar affective disorder, manic, severe (HCC) Diagnosis:  Principal Problem:   Bipolar affective disorder, manic, severe (HCC)   Total Time spent with patient: 45 minutes  Subjective:   Beth Berry is a 43 y.o. female patient admitted with mania.  HPI:  43 yo female who presented to the ED with mania.  Pressured speech, hyperverbal, tangential, and increase in energy.  Medications were started yesterday and she is much calmer.  Upset earlier this morning when she could not leave but calmed down without PRN agitation medications.  This afternoon, her speech was normal, less tangential.  No suicidal/homicidal ideations, hallucinations, or substance abuse.  Hypomania remains with appropriate behaviors, not at her baseline.  Mania occurs when she stops taking her medications and has lack of sleep, improves with medications and sleep.  She did sleep last night and is improving.  Past Psychiatric History: bipolar disorder  Risk to Self: Suicidal Ideation: No-Not Currently/Within Last 6 Months Suicidal Intent: No Is patient at risk for suicide?: No Suicidal Plan?: No(Pt denies. ) Access to Means: Yes Specify Access to Suicidal Means: Pt reported, Guns, knives, "beat stick."  What has been your use of drugs/alcohol within the last 12 months?: UDS is pending.  How many times?: 2 Other Self Harm Risks: Pt reported, burning. Triggers for Past Attempts: Unknown Intentional Self Injurious Behavior: Burning Comment - Self Injurious Behavior: Pt reported, she was experimenting to see if burning herself is okay.  Risk to Others: Homicidal Ideation: No(Pt denies. ) Thoughts of Harm to Others: No(Pt denies. ) Current Homicidal Intent: No Current Homicidal Plan: No(Pt denies. ) Access to Homicidal Means: No Identified Victim:  NA History of harm to others?: No Assessment of Violence: None Noted Violent Behavior Description: NA Does patient have access to weapons?: No Criminal Charges Pending?: No Does patient have a court date: No Prior Inpatient Therapy: Prior Inpatient Therapy: Yes Prior Therapy Dates: 03/04/2018-03/04/2018. Prior Therapy Facilty/Provider(s): [facility]. Reason for Treatment: Bipolar manic episode, PTSD.  Prior Outpatient Therapy: Prior Outpatient Therapy: Yes Prior Therapy Dates: UTA Prior Therapy Facilty/Provider(s): Myriam Jacobson a Veterinary surgeon in [place]. Reason for Treatment: Therapy.  Does patient have an ACCT team?: No Does patient have Intensive In-House Services?  : No Does patient have [facility] services? : No Does patient have P4CC services?: No  Past Medical History:  Past Medical History:  Diagnosis Date  . Bipolar 1 disorder (HCC)   . Contusion of great toe of right foot 06/11/2011  . Drug abuse (HCC)   . Schizophrenia [facility])     Past Surgical History:  Procedure Laterality Date  . DENTAL SURGERY     Family History:  Family History  Problem Relation Age of Onset  . Hypertension Mother   . Drug abuse Mother   . Drug abuse Father    Family Psychiatric  History: see above Social History:  Social History   Substance and Sexual Activity  Alcohol Use Yes   Comment: rarely     Social History   Substance and Sexual Activity  Drug Use Yes  . Types: Heroin, MDMA (Ecstacy), Cocaine   Comment: none now, hx of marijuana, narcotic, heroin, cocaine- last use 3 years    Social History   Socioeconomic History  . Marital status: Significant Other    Spouse name: Not on file  . Number of children: Not on file  .  Years of education: Not on file  . Highest education level: Not on file  Occupational History  . Not on file  Social Needs  . Financial resource strain: Not on file  . Food insecurity:    Worry: Not on file    Inability: Not on file  . Transportation needs:     Medical: Not on file    Non-medical: Not on file  Tobacco Use  . Smoking status: Current Some Day Smoker    Packs/day: 1.50    Types: Cigarettes  . Smokeless tobacco: Never Used  Substance and Sexual Activity  . Alcohol use: Yes    Comment: rarely  . Drug use: Yes    Types: Heroin, MDMA (Ecstacy), Cocaine    Comment: none now, hx of marijuana, narcotic, heroin, cocaine- last use 3 years  . Sexual activity: Yes    Birth control/protection: None  Lifestyle  . Physical activity:    Days per week: Not on file    Minutes per session: Not on file  . Stress: Not on file  Relationships  . Social connections:    Talks on phone: Not on file    Gets together: Not on file    Attends religious service: Not on file    Active member of club or organization: Not on file    Attends meetings of clubs or organizations: Not on file    Relationship status: Not on file  Other Topics Concern  . Not on file  Social History Narrative  . Not on file   Additional Social History: N/A    Allergies:   Allergies  Allergen Reactions  . Onion Itching  . Shellfish Allergy Hives  . Haloperidol Hives  . Lamictal [Lamotrigine] Itching and Other (See Comments)    Caused "stevens johnson syndrome"    Labs:  Results for orders placed or performed during the hospital encounter of 04/06/18 (from the past 48 hour(s))  Comprehensive metabolic panel     Status: Abnormal   Collection Time: 04/06/18 11:55 PM  Result Value Ref Range   Sodium 137 135 - 145 mmol/L   Potassium 3.0 (L) 3.5 - 5.1 mmol/L   Chloride 104 98 - 111 mmol/L   CO2 22 22 - 32 mmol/L   Glucose, Bld 86 70 - 99 mg/dL   BUN 7 6 - 20 mg/dL   Creatinine, Ser 9.14 0.44 - 1.00 mg/dL   Calcium 8.6 (L) 8.9 - 10.3 mg/dL   Total Protein 7.9 6.5 - 8.1 g/dL   Albumin 4.0 3.5 - 5.0 g/dL   AST 25 15 - 41 U/L   ALT 17 0 - 44 U/L   Alkaline Phosphatase 59 38 - 126 U/L   Total Bilirubin 0.5 0.3 - 1.2 mg/dL   GFR calc non Af Amer >60 >60 mL/min    GFR calc Af Amer >60 >60 mL/min   Anion gap 11 5 - 15    Comment: Performed at [facility], [address] [place], [place] [address]  Ethanol     Status: None   Collection Time: 04/06/18 11:55 PM  Result Value Ref Range   Alcohol, Ethyl (B) <10 <10 mg/dL    Comment: (NOTE) Lowest detectable limit for serum alcohol is 10 mg/dL. For medical purposes only. Performed at [facility], [address], [place], [place] [address]   Salicylate level     Status: None   Collection Time: 04/06/18 11:55 PM  Result Value Ref Range  Salicylate Lvl <7.0 2.8 - 30.0 mg/dL    Comment: Performed at [facility], [address] [place], [place] [address]  Acetaminophen level     Status: Abnormal   Collection Time: 04/06/18 11:55 PM  Result Value Ref Range   Acetaminophen (Tylenol), Serum <10 (L) 10 - 30 ug/mL    Comment: (NOTE) Therapeutic concentrations vary significantly. A range of 10-30 ug/mL  may be an effective concentration for many patients. However, some  are best treated at concentrations outside of this range. Acetaminophen concentrations >150 ug/mL at 4 hours after ingestion  and >50 ug/mL at 12 hours after ingestion are often associated with  toxic reactions. Performed at [facility], [address] [place], [place] [address]   cbc     Status: Abnormal   Collection Time: 04/06/18 11:55 PM  Result Value Ref Range   WBC 14.8 (H) 4.0 - 10.5 K/uL   RBC 4.20 3.87 - 5.11 MIL/uL   Hemoglobin 11.6 (L) 12.0 - 15.0 g/dL   HCT 09.837.1 11.936.0 - 14.746.0 %   MCV 88.3 80.0 - 100.0 fL   MCH 27.6 26.0 - 34.0 pg   MCHC 31.3 30.0 - 36.0 g/dL   RDW 82.915.0 56.211.5 - 13.015.5 %   Platelets 286 150 - 400 K/uL   nRBC 0.0 0.0 - 0.2 %    Comment: Performed at [facility], [address] [place], [place] [address]  Rapid urine drug screen (hospital performed)     Status: None   Collection Time: 04/06/18 11:55 PM   Result Value Ref Range   Opiates NONE DETECTED NONE DETECTED   Cocaine NONE DETECTED NONE DETECTED   Benzodiazepines NONE DETECTED NONE DETECTED   Amphetamines NONE DETECTED NONE DETECTED   Tetrahydrocannabinol NONE DETECTED NONE DETECTED   Barbiturates NONE DETECTED NONE DETECTED    Comment: (NOTE) DRUG SCREEN FOR MEDICAL PURPOSES ONLY.  IF CONFIRMATION IS NEEDED FOR ANY PURPOSE, NOTIFY LAB WITHIN 5 DAYS. LOWEST DETECTABLE LIMITS FOR URINE DRUG SCREEN Drug Class                     Cutoff (ng/mL) Amphetamine and metabolites    1000 Barbiturate and metabolites    200 Benzodiazepine                 200 Tricyclics and metabolites     300 Opiates and metabolites        300 Cocaine and metabolites        300 THC                            50 Performed at [facility], [address] [place], [place] [address]   I-Stat beta hCG blood, ED     Status: None   Collection Time: 04/07/18 12:02 AM  Result Value Ref Range   I-stat hCG, quantitative <5.0 <5 mIU/mL   Comment 3            Comment:   GEST. AGE      CONC.  (mIU/mL)   <=1 WEEK        5 - 50     2 WEEKS       50 - 500     3 WEEKS       100 - 10,000     4 WEEKS     1,000 - 30,000  FEMALE AND NON-PREGNANT FEMALE:     LESS THAN 5 mIU/mL     Current Facility-Administered Medications  Medication Dose Route Frequency Provider Last Rate Last Dose  . asenapine (SAPHRIS) sublingual tablet 10 mg  10 mg Sublingual BID Charm RingsLord, Jamison Y, NP   10 mg at 04/08/18 [id number]  . diphenhydrAMINE (BENADRYL) capsule 50 mg  50 mg Oral Q6H PRN Charm RingsLord, Jamison Y, NP      . diphenhydrAMINE (BENADRYL) injection 50 mg  50 mg Intramuscular Once PRN Charm RingsLord, Jamison Y, NP      . LORazepam (ATIVAN) injection 2 mg  2 mg Intramuscular Once PRN Charm RingsLord, Jamison Y, NP      . LORazepam (ATIVAN) tablet 1 mg  1 mg Oral Once Loren RacerYelverton, David, MD      . ziprasidone (GEODON) injection 20 mg  20 mg Intramuscular Once PRN Charm RingsLord, Jamison Y, NP        Current Outpatient Medications  Medication Sig Dispense Refill  . clonazePAM (KLONOPIN) 0.5 MG tablet Take 0.5 mg by mouth daily as needed for anxiety.     . hydrOXYzine (ATARAX/VISTARIL) 25 MG tablet Take 25 mg by mouth 2 (two) times daily.     Marland Kitchen. loratadine (CLARITIN) 10 MG tablet Take 10 mg by mouth daily.    . meloxicam (MOBIC) 15 MG tablet Take 15 mg by mouth daily.    . metFORMIN (GLUCOPHAGE-XR) 500 MG 24 hr tablet Take 500 mg by mouth 2 (two) times daily.    . norgestimate-ethinyl estradiol (ORTHO-CYCLEN,SPRINTEC,PREVIFEM) 0.25-35 MG-MCG tablet Take 1 tablet by mouth daily.    Marland Kitchen. oxybutynin (DITROPAN) 5 MG tablet Take 5 mg by mouth 2 (two) times daily.    . prazosin (MINIPRESS) 1 MG capsule Take 1 mg by mouth at bedtime.    . propranolol (INDERAL) 10 MG tablet Take 10 mg by mouth 2 (two) times daily.    . risperiDONE (RISPERDAL) 1 MG tablet Take 1 mg by mouth 2 (two) times daily.    . traZODone (DESYREL) 50 MG tablet Take 50 mg by mouth at bedtime.    . Vitamin D, Ergocalciferol, (DRISDOL) 1.25 MG (50000 UT) CAPS capsule Take 50,000 Units by mouth once a week.      Musculoskeletal: Strength & Muscle Tone: within normal limits Gait & Station: normal Patient leans: N/A  Psychiatric Specialty Exam: Physical Exam  Nursing note and vitals reviewed. Constitutional: She is oriented to person, place, and time. She appears well-developed and well-nourished.  HENT:  Head: Normocephalic.  Neck: Normal range of motion.  Cardiovascular: Normal rate.  Respiratory: Effort normal.  Musculoskeletal: Normal range of motion.  Neurological: She is alert and oriented to person, place, and time.  Psychiatric: Her mood appears anxious. Her affect is labile. Her speech is tangential. She is hyperactive. Thought content is delusional. Cognition and memory are impaired. She expresses inappropriate judgment. She is inattentive.    Review of Systems  Psychiatric/Behavioral: The patient is  nervous/anxious.   All other systems reviewed and are negative.   Blood pressure (!) 155/85, pulse 85, temperature 98.1 F (36.7 C), temperature source Oral, resp. rate 15, height 5\' 7"  (1.702 m), weight 104.3 kg, SpO2 100 %.Body mass index is 36.02 kg/m.  General Appearance: Disheveled  Eye Contact:  Good  Speech:  Normal Rate  Volume:  Normal  Mood:  Anxious  Affect:  Labile  Thought Process:  Coherent and Descriptions of Associations: Tangential  Orientation:  Full (Time, Place, and Person)  Thought Content:  Delusions  Suicidal Thoughts:  No  Homicidal Thoughts:  No  Memory:  Immediate;   Fair Recent;   Fair Remote;   Fair  Judgement:  Impaired  Insight:  Lacking  Psychomotor Activity:  Increased  Concentration:  Concentration: Fair and Attention Span: Poor  Recall:  [employer] of Knowledge:  Fair  Language:  Good  Akathisia:  No  Handed:  Right  AIMS (if indicated):   N/A  Assets:  Leisure Time Physical Health Resilience  ADL's:  Intact  Cognition:  WNL  Sleep:   N/A     Treatment Plan Summary: Daily contact with patient to assess and evaluate symptoms and progress in treatment, Medication management and Plan bipolar affective disorder, mania, moderate:  -Started Saphris 10 mg BID  -PRN agitation medications in place  Somatic itching: -Benadryl 50 mg every six hours PRN started  Disposition: Recommend psychiatric Inpatient admission when medically cleared.  Nanine Means, NP 04/08/2018 2:01 PM   Patient seen face-to-face for psychiatric evaluation, chart reviewed and case discussed with the physician extender and developed treatment plan. Reviewed the information documented and agree with the treatment plan.  Juanetta Beets, DO 04/08/18 4:50 PM

## 2018-04-08 NOTE — ED Notes (Signed)
PT SITTING ON BED WITH ONLY A SHIRT. EATING BREAKFAST. PT WILL ACKNOWLEDGE THIS WRITER AND RESPOND.

## 2018-04-09 ENCOUNTER — Other Ambulatory Visit: Payer: Self-pay

## 2018-04-09 ENCOUNTER — Inpatient Hospital Stay
Admission: AD | Admit: 2018-04-09 | Discharge: 2018-04-13 | DRG: 885 | Disposition: A | Discharge status: Home / Self Care | Payer: Medicare Other | Attending: Psychiatry | Admitting: Psychiatry

## 2018-04-09 DIAGNOSIS — Z8249 Family history of ischemic heart disease and other diseases of the circulatory system: Secondary | ICD-10-CM | POA: Diagnosis not present

## 2018-04-09 DIAGNOSIS — F250 Schizoaffective disorder, bipolar type: Principal | ICD-10-CM | POA: Diagnosis present

## 2018-04-09 DIAGNOSIS — Z9114 Patient's other noncompliance with medication regimen: Secondary | ICD-10-CM

## 2018-04-09 DIAGNOSIS — F3113 Bipolar disorder, current episode manic without psychotic features, severe: Secondary | ICD-10-CM | POA: Diagnosis not present

## 2018-04-09 DIAGNOSIS — Z813 Family history of other psychoactive substance abuse and dependence: Secondary | ICD-10-CM

## 2018-04-09 DIAGNOSIS — F4310 Post-traumatic stress disorder, unspecified: Secondary | ICD-10-CM | POA: Diagnosis present

## 2018-04-09 DIAGNOSIS — R7303 Prediabetes: Secondary | ICD-10-CM | POA: Diagnosis present

## 2018-04-09 DIAGNOSIS — Z915 Personal history of self-harm: Secondary | ICD-10-CM

## 2018-04-09 LAB — HEMOGLOBIN A1C
Hgb A1c MFr Bld: 5.7 % — ABNORMAL HIGH (ref 4.8–5.6)
Mean Plasma Glucose: 116.89 mg/dL

## 2018-04-09 MED ORDER — OXYBUTYNIN CHLORIDE 5 MG PO TABS
5.0000 mg | ORAL_TABLET | Freq: Two times a day (BID) | ORAL | Status: DC
  Filled 2018-04-09: qty 1

## 2018-04-09 MED ORDER — HYDROXYZINE HCL 25 MG PO TABS
25.0000 mg | ORAL_TABLET | Freq: Two times a day (BID) | ORAL | Status: DC
  Administered 2018-04-09 – 2018-04-13 (×8): 25 mg via ORAL
  Filled 2018-04-09 (×8): qty 1

## 2018-04-09 MED ORDER — MAGNESIUM HYDROXIDE 400 MG/5ML PO SUSP: 30.0000 mL | Freq: Every day | ORAL | Status: DC | PRN

## 2018-04-09 MED ORDER — DIPHENHYDRAMINE HCL 25 MG PO CAPS: 50.0000 mg | ORAL_CAPSULE | Freq: Four times a day (QID) | ORAL | Status: DC | PRN

## 2018-04-09 MED ORDER — NORGESTIMATE-ETH ESTRADIOL 0.25-35 MG-MCG PO TABS: 1.0000 | ORAL_TABLET | Freq: Every day | ORAL | Status: DC

## 2018-04-09 MED ORDER — METFORMIN HCL ER 500 MG PO TB24: 500.0000 mg | ORAL_TABLET | Freq: Two times a day (BID) | ORAL | Status: DC

## 2018-04-09 MED ORDER — LORAZEPAM 2 MG/ML IJ SOLN: 2.0000 mg | Freq: Once | INTRAMUSCULAR | Status: DC | PRN

## 2018-04-09 MED ORDER — DIPHENHYDRAMINE HCL 50 MG/ML IJ SOLN: 50.0000 mg | Freq: Once | INTRAMUSCULAR | Status: DC | PRN

## 2018-04-09 MED ORDER — PRAZOSIN HCL 1 MG PO CAPS: 1.0000 mg | ORAL_CAPSULE | Freq: Every day | ORAL | Status: DC

## 2018-04-09 MED ORDER — MELOXICAM 7.5 MG PO TABS
15.0000 mg | ORAL_TABLET | Freq: Every day | ORAL | Status: DC
  Administered 2018-04-09 – 2018-04-13 (×5): 15 mg via ORAL
  Filled 2018-04-09 (×5): qty 2

## 2018-04-09 MED ORDER — ACETAMINOPHEN 325 MG PO TABS
650.0000 mg | ORAL_TABLET | Freq: Four times a day (QID) | ORAL | Status: DC | PRN
  Administered 2018-04-12 (×2): 650 mg via ORAL
  Filled 2018-04-09 (×2): qty 2

## 2018-04-09 MED ORDER — ZIPRASIDONE MESYLATE 20 MG IM SOLR: 20.0000 mg | INTRAMUSCULAR | Status: DC | PRN

## 2018-04-09 MED ORDER — LORAZEPAM 1 MG PO TABS: 1.0000 mg | ORAL_TABLET | ORAL | Status: DC | PRN

## 2018-04-09 MED ORDER — HYDROCERIN EX CREA
TOPICAL_CREAM | Freq: Two times a day (BID) | CUTANEOUS | Status: DC
  Administered 2018-04-10 – 2018-04-13 (×7): via TOPICAL
  Filled 2018-04-09: qty 113

## 2018-04-09 MED ORDER — ASENAPINE MALEATE 5 MG SL SUBL
10.0000 mg | SUBLINGUAL_TABLET | Freq: Two times a day (BID) | SUBLINGUAL | Status: DC
  Administered 2018-04-10 – 2018-04-13 (×7): 10 mg via SUBLINGUAL
  Filled 2018-04-09 (×10): qty 2

## 2018-04-09 MED ORDER — ALUM & MAG HYDROXIDE-SIMETH 200-200-20 MG/5ML PO SUSP: 30.0000 mL | ORAL | Status: DC | PRN

## 2018-04-09 MED ORDER — LORATADINE 10 MG PO TABS
10.0000 mg | ORAL_TABLET | Freq: Every day | ORAL | Status: DC
  Administered 2018-04-09 – 2018-04-13 (×5): 10 mg via ORAL
  Filled 2018-04-09 (×5): qty 1

## 2018-04-09 MED ORDER — OLANZAPINE 5 MG PO TBDP: 10.0000 mg | ORAL_TABLET | Freq: Three times a day (TID) | ORAL | Status: DC | PRN

## 2018-04-09 MED ORDER — TRAZODONE HCL 50 MG PO TABS
50.0000 mg | ORAL_TABLET | Freq: Every day | ORAL | Status: DC
  Administered 2018-04-09 – 2018-04-10 (×2): 50 mg via ORAL
  Filled 2018-04-09 (×3): qty 1

## 2018-04-09 MED ORDER — PROPRANOLOL HCL 10 MG PO TABS
10.0000 mg | ORAL_TABLET | Freq: Two times a day (BID) | ORAL | Status: DC
  Administered 2018-04-10 – 2018-04-13 (×7): 10 mg via ORAL
  Filled 2018-04-09 (×9): qty 1

## 2018-04-09 NOTE — Tx Team (Signed)
Initial Treatment Plan 04/09/2018 1:24 PM Beth Raiderabitha Augusta ZOX:[id number]    PATIENT STRESSORS: Health problems Medication change or noncompliance   PATIENT STRENGTHS: Capable of independent living Communication skills Supportive family/friends   PATIENT IDENTIFIED PROBLEMS: Psychosis 04/09/2018  Noncompliant With Medication  04/09/2018                   DISCHARGE CRITERIA:  Ability to meet basic life and health needs Improved stabilization in mood, thinking, and/or behavior  PRELIMINARY DISCHARGE PLAN: Outpatient therapy Return to previous living arrangement  PATIENT/FAMILY INVOLVEMENT: This treatment plan has been presented to and reviewed with the patient, Beth Berry, and/or family member,   The patient and family have been given the opportunity to ask questions and make suggestions.  Crist InfanteGwen A Teandra Harlan, RN 04/09/2018, 1:24 PM

## 2018-04-09 NOTE — Plan of Care (Signed)
  Problem: Education: Goal: Knowledge of [facility] Education information/materials will improve Note:  Unable to understand information presented at this time . Staff will continue to redirect

## 2018-04-09 NOTE — [facility] Suicide Risk Assessment (Signed)
[facility] Admission Suicide Risk Assessment   Nursing information obtained from:  Patient Demographic factors:  Low socioeconomic status Current Mental Status:  NA Loss Factors:  NA Historical Factors:  Impulsivity Risk Reduction Factors:  Positive coping skills or problem solving skills  Total Time spent with patient: 1 hour Principal Problem: <principal problem not specified> Diagnosis:  Active Problems:   Schizoaffective disorder, bipolar type (HCC)  Subjective Data: Patient with a history of bipolar disorder or schizoaffective disorder admitted to the hospital in transfer from [place].  Continues to show hypomanic symptoms.  Patient denies any suicidal ideation.  Has not shown any recent suicidal behavior.  Not violent or agitated.  Psychosis under control for the most part.  Continued Clinical Symptoms:  Alcohol Use Disorder Identification Test Final Score (AUDIT): 0 The "Alcohol Use Disorders Identification Test", Guidelines for Use in Primary Care, Second Edition.  World Science writer [facility]). Score between 0-7:  no or low risk or alcohol related problems. Score between 8-15:  moderate risk of alcohol related problems. Score between 16-19:  high risk of alcohol related problems. Score 20 or above:  warrants further diagnostic evaluation for alcohol dependence and treatment.   CLINICAL FACTORS:   Schizophrenia:   Paranoid or undifferentiated type   Musculoskeletal: Strength & Muscle Tone: within normal limits Gait & Station: normal Patient leans: N/A  Psychiatric Specialty Exam: Physical Exam  Nursing note and vitals reviewed. Constitutional: She appears well-developed and well-nourished.  HENT:  Head: Normocephalic and atraumatic.  Eyes: Pupils are equal, round, and reactive to light. Conjunctivae are normal.  Neck: Normal range of motion.  Cardiovascular: Regular rhythm and normal heart sounds.  Respiratory: Effort normal.  GI: Soft.  Musculoskeletal: Normal  range of motion.  Neurological: She is alert.  Skin: Skin is warm and dry.  Psychiatric: Her affect is labile. Her speech is rapid and/or pressured and tangential. She is agitated. She is not aggressive. Thought content is paranoid. Cognition and memory are impaired. She expresses impulsivity. She expresses no homicidal and no suicidal ideation.    Review of Systems  Constitutional: Negative.   HENT: Negative.   Eyes: Negative.   Respiratory: Negative.   Cardiovascular: Negative.   Gastrointestinal: Negative.   Musculoskeletal: Negative.   Skin: Negative.   Neurological: Negative.   Psychiatric/Behavioral: Negative for depression, hallucinations, memory loss, substance abuse and suicidal ideas. The patient is nervous/anxious and has insomnia.     Blood pressure (!) 158/99, pulse 85, temperature 98.1 F (36.7 C), temperature source Oral, resp. rate 18, height 5\' 7"  (1.702 m), weight 98.4 kg, last menstrual period 04/08/2018, SpO2 98 %.Body mass index is 33.99 kg/m.  General Appearance: Disheveled  Eye Contact:  Good  Speech:  Pressured  Volume:  Increased  Mood:  Anxious  Affect:  Labile  Thought Process:  Coherent  Orientation:  Full (Time, Place, and Person)  Thought Content:  Rumination  Suicidal Thoughts:  No  Homicidal Thoughts:  No  Memory:  Immediate;   Fair Recent;   Fair Remote;   Fair  Judgement:  Impaired  Insight:  Shallow  Psychomotor Activity:  Increased  Concentration:  Concentration: Fair  Recall:  [employer] of Knowledge:  Fair  Language:  Fair  Akathisia:  No  Handed:  Right  AIMS (if indicated):     Assets:  Desire for Improvement Physical Health Resilience Social Support  ADL's:  Impaired  Cognition:  WNL  Sleep:         COGNITIVE FEATURES THAT  CONTRIBUTE TO RISK:  Loss of executive function    SUICIDE RISK:   Minimal: No identifiable suicidal ideation.  Patients presenting with no risk factors but with morbid ruminations; may be  classified as minimal risk based on the severity of the depressive symptoms  PLAN OF CARE: Patient will be treated for schizoaffective disorder.  Continue 15-minute checks.  We will make contact with family and work on appropriate discharge planning and reassess suicidality before then.  I certify that inpatient services furnished can reasonably be expected to improve the patient's condition.   Mordecai RasmussenJohn Malaiya Paczkowski, MD 04/09/2018, 3:01 PM
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
## 2018-04-09 NOTE — Tx Team (Signed)
Initial Treatment Plan 04/09/2018 1:57 PM Beth Berry PXT:[id number]    PATIENT STRESSORS: Financial difficulties Marital or family conflict Medication change or noncompliance   PATIENT STRENGTHS: Barrister's clerk for treatment/growth   PATIENT IDENTIFIED PROBLEMS: Medication compliance  Difficulty with interpersonal relationships  Impulsive behavior                 DISCHARGE CRITERIA:  Ability to meet basic life and health needs Adequate post-discharge living arrangements Improved stabilization in mood, thinking, and/or behavior Verbal commitment to aftercare and medication compliance  PRELIMINARY DISCHARGE PLAN: Outpatient therapy Placement in alternative living arrangements  PATIENT/FAMILY INVOLVEMENT: This treatment plan has been presented to and reviewed with the patient, Beth Berry, and/or family member.  The patient and family have been given the opportunity to ask questions and make suggestions.  Leamon Arnt, RN 04/09/2018, 1:57 PM
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
## 2018-04-09 NOTE — ED Notes (Signed)
Patient c/o a rash on her legs that is pinkish, raised, and circular in some areas.  Patient reports the rash itches and she is currently scratching it.  Patient also has cracked heels in which open skin is showing through.  Patient reports this is painful.  MD notified.

## 2018-04-09 NOTE — [facility] Group Notes (Signed)
[facility] LCSW Group Therapy Note  Date/Time: 04/09/18, 1300  Type of Therapy/Topic:  Group Therapy:  Feelings about Diagnosis  Participation Level:  Did Not Attend   Mood:   Description of Group:    This group will allow patients to explore their thoughts and feelings about diagnoses they have received. Patients will be guided to explore their level of understanding and acceptance of these diagnoses. Facilitator will encourage patients to process their thoughts and feelings about the reactions of others to their diagnosis, and will guide patients in identifying ways to discuss their diagnosis with significant others in their lives. This group will be process-oriented, with patients participating in exploration of their own experiences as well as giving and receiving support and challenge from other group members.   Therapeutic Goals: 1. Patient will demonstrate understanding of diagnosis as evidence by identifying two or more symptoms of the disorder:  2. Patient will be able to express two feelings regarding the diagnosis 3. Patient will demonstrate ability to communicate their needs through discussion and/or role plays  Summary of Patient Progress:        Therapeutic Modalities:   Cognitive Behavioral Therapy Brief Therapy Feelings Identification   Greg Larell Baney, LCSW 

## 2018-04-09 NOTE — Progress Notes (Signed)
Patient ID: Beth Berry, female   DOB: 14-Apr-1975, 43 y.o.   MRN: [id number] Per State regulations 482.30 this chart was reviewed for medical necessity with respect to the patient's admission/duration of stay.    Next review date: 04/13/2018  Thurman Coyer, BSN, RN-BC  Case Manager

## 2018-04-09 NOTE — ED Notes (Signed)
Report called to Larina Bras RN at [facility] called for transport.

## 2018-04-09 NOTE — ED Notes (Signed)
Dr. Charm Barges came over to assess patient's rash and he recommended giving her the prn benadryl that was already ordered and putting bacitracin on her cracked heel.

## 2018-04-09 NOTE — Plan of Care (Signed)
Patient  is alert and oriented stable and responding well to care , complying with her medication regimen no side effects noted , rated depression and anxiety @ 4/10 , patient reports feeling fine, needs no help with ADLs , needs no PRNs for now.   Patient is able to voice and identify positive attributes of self and coping skills, patient admit feeling easily frustrated with things around her  and feeling of fatigue , report sleeping good with out interruptions , and appetite is good .  Patient is encouraged to participate in group activities with peers, denies any SI/HI/AVH , remind patient 15 minutes safety checks is maintained no distress .   Problem: Education: Goal: Knowledge of [facility] Education information/materials will improve Outcome: Progressing Goal: Emotional status will improve Outcome: Progressing Goal: Mental status will improve Outcome: Progressing Goal: Verbalization of understanding the information provided will improve Outcome: Progressing   Problem: Coping: Goal: Coping ability will improve Outcome: Progressing Goal: Will verbalize feelings Outcome: Progressing   Problem: Safety: Goal: Ability to redirect hostility and anger into socially appropriate behaviors will improve Outcome: Progressing Goal: Ability to remain free from injury will improve Outcome: Progressing

## 2018-04-10 NOTE — Progress Notes (Signed)
D: Patient stated slept fair last night .Stated appetite good and energy level   normal. Stated concentration is good . Stated on Depression scale 3 , hopeless 0 and anxiety 5 .( low 0-10 high) Denies suicidal  homicidal ideations  .  No auditory hallucinations  No pain concerns . Appropriate ADL'S. Interacting with peers and staff. Patient voice of working on " I would like to work on not  being  So scared  "  A: Encourage patient participation with unit programming . Instruction  Given on  Medication , verbalize understanding.Educated patient on Behavioral medicine  unit programing , verbalize understanding Emotional and mental  status  improved   Information  given  in concrete for for better understanding Working  on coping skills  Attending unit programing , able to better verbalize  feeling No anger outburst  or hostility directed  at staff or peers Patient voice of no safety concerns    R: Voice no other concerns. Staff continue to monitor

## 2018-04-10 NOTE — Tx Team (Addendum)
Interdisciplinary Treatment and Diagnostic Plan Update  04/10/2018 Time of Session: 230pm Beth Berry MRN: [id number]  Principal Diagnosis: Schizoaffective disorder, bipolar type [facility](HCC)  Secondary Diagnoses: Principal Problem:   Schizoaffective disorder, bipolar type (HCC) Active Problems:   PTSD (post-traumatic stress disorder)   Prediabetes   Current Medications:  Current Facility-Administered Medications  Medication Dose Route Frequency Provider Last Rate Last Dose  . acetaminophen (TYLENOL) tablet 650 mg  650 mg Oral Q6H PRN [name], [name], MD      . alum & mag hydroxide-simeth (MAALOX/MYLANTA) 200-200-20 MG/5ML suspension 30 mL  30 mL Oral Q4H PRN [name], [name], MD      . asenapine (SAPHRIS) sublingual tablet 10 mg  10 mg Sublingual BID [name], [name], MD   10 mg at 04/10/18 0815  . hydrocerin (EUCERIN) cream   Topical BID Clapacs, [name], MD      . hydrOXYzine (ATARAX/VISTARIL) tablet 25 mg  25 mg Oral BID [name], [name], MD   25 mg at 04/10/18 0815  . loratadine (CLARITIN) tablet 10 mg  10 mg Oral Daily [name], [name], MD   10 mg at 04/10/18 0815  . magnesium hydroxide (MILK OF MAGNESIA) suspension 30 mL  30 mL Oral Daily PRN [name], [name], MD      . meloxicam [facility](MOBIC) tablet 15 mg  15 mg Oral Daily [name], [name], MD   15 mg at 04/10/18 [id number]  . norgestimate-ethinyl estradiol (ORTHO-CYCLEN,SPRINTEC,PREVIFEM) 0.25-35 MG-MCG tablet 1 tablet  1 tablet Oral Daily Clapacs, John T, MD      . propranolol (INDERAL) tablet 10 mg  10 mg Oral BID [name], [name], MD   10 mg at 04/10/18 0815  . traZODone (DESYREL) tablet 50 mg  50 mg Oral QHS [name], [name], MD   50 mg at 04/09/18 2129   PTA Medications: Medications Prior to Admission  Medication Sig Dispense Refill Last Dose  . clonazePAM (KLONOPIN) 0.5 MG tablet Take 0.5 mg by mouth daily as needed for anxiety.    unknown  . hydrOXYzine (ATARAX/VISTARIL) 25 MG tablet Take 25 mg by mouth 2 (two) times daily.    unknown   . loratadine (CLARITIN) 10 MG tablet Take 10 mg by mouth daily.   unknown  . meloxicam (MOBIC) 15 MG tablet Take 15 mg by mouth daily.   unknown  . metFORMIN (GLUCOPHAGE-XR) 500 MG 24 hr tablet Take 500 mg by mouth 2 (two) times daily.   unknown  . norgestimate-ethinyl estradiol (ORTHO-CYCLEN,SPRINTEC,PREVIFEM) 0.25-35 MG-MCG tablet Take 1 tablet by mouth daily.   unknown  . oxybutynin (DITROPAN) 5 MG tablet Take 5 mg by mouth 2 (two) times daily.   unknown  . prazosin (MINIPRESS) 1 MG capsule Take 1 mg by mouth at bedtime.   unknown  . propranolol (INDERAL) 10 MG tablet Take 10 mg by mouth 2 (two) times daily.   unknown at unknown  . risperiDONE (RISPERDAL) 1 MG tablet Take 1 mg by mouth 2 (two) times daily.   unknown  . traZODone (DESYREL) 50 MG tablet Take 50 mg by mouth at bedtime.   unknown  . Vitamin D, Ergocalciferol, (DRISDOL) 1.25 MG (50000 UT) CAPS capsule Take 50,000 Units by mouth once a week.   unknown    Patient Stressors: Financial difficulties Marital or family conflict Medication change or noncompliance  Patient Strengths: Barrister's clerkCommunication skills Motivation for treatment/growth  Treatment Modalities: Medication Management, Group therapy, Case management,  1 to 1 session with clinician, Psychoeducation, Recreational therapy.  Physician Treatment Plan for Primary Diagnosis: Schizoaffective disorder, bipolar type (HCC) Long Term Goal(s): Improvement in symptoms so as ready for discharge Improvement in symptoms so as ready for discharge   Short Term Goals: Ability to demonstrate self-control will improve Ability to identify and develop effective coping behaviors will improve Compliance with prescribed medications will improve  Medication Management: Evaluate patient's response, side effects, and tolerance of medication regimen.  Therapeutic Interventions: 1 to 1 sessions, Unit Group sessions and Medication administration.  Evaluation of Outcomes:  Progressing  Physician Treatment Plan for Secondary Diagnosis: Principal Problem:   Schizoaffective disorder, bipolar type (HCC) Active Problems:   PTSD (post-traumatic stress disorder)   Prediabetes  Long Term Goal(s): Improvement in symptoms so as ready for discharge Improvement in symptoms so as ready for discharge   Short Term Goals: Ability to demonstrate self-control will improve Ability to identify and develop effective coping behaviors will improve Compliance with prescribed medications will improve     Medication Management: Evaluate patient's response, side effects, and tolerance of medication regimen.  Therapeutic Interventions: 1 to 1 sessions, Unit Group sessions and Medication administration.  Evaluation of Outcomes: Progressing   RN Treatment Plan for Primary Diagnosis: Schizoaffective disorder, bipolar type (HCC) Long Term Goal(s): Knowledge of disease and therapeutic regimen to maintain health will improve  Short Term Goals: Ability to participate in decision making will improve, Ability to verbalize feelings will improve, Ability to disclose and discuss suicidal ideas, Ability to identify and develop effective coping behaviors will improve and Compliance with prescribed medications will improve  Medication Management: RN will administer medications as ordered by provider, will assess and evaluate patient's response and provide education to patient for prescribed medication. RN will report any adverse and/or side effects to prescribing provider.  Therapeutic Interventions: 1 on 1 counseling sessions, Psychoeducation, Medication administration, Evaluate responses to treatment, Monitor vital signs and CBGs as ordered, Perform/monitor CIWA, COWS, AIMS and Fall Risk screenings as ordered, Perform wound care treatments as ordered.  Evaluation of Outcomes: Progressing   LCSW Treatment Plan for Primary Diagnosis: Schizoaffective disorder, bipolar type (HCC) Long Term  Goal(s): Safe transition to appropriate next level of care at discharge, Engage patient in therapeutic group addressing interpersonal concerns.  Short Term Goals: Engage patient in aftercare planning with referrals and resources  Therapeutic Interventions: Assess for all discharge needs, 1 to 1 time with Social worker, Explore available resources and support systems, Assess for adequacy in community support network, Educate family and significant other(s) on suicide prevention, Complete Psychosocial Assessment, Interpersonal group therapy.  Evaluation of Outcomes: Progressing   Progress in Treatment: Attending groups: Yes. Participating in groups: Yes. Taking medication as prescribed: Yes. Toleration medication: Yes. Family/Significant other contact made: No, will contact:  Gerarda FractionMelissa Anderson, friend Patient understands diagnosis: Yes. Discussing patient identified problems/goals with staff: Yes. Medical problems stabilized or resolved: Yes. Denies suicidal/homicidal ideation: Yes. Issues/concerns per patient self-inventory: No. Other: NA  New problem(s) identified: No, Describe:  none reported  New Short Term/Long Term Goal(s): "Take medication properly, write down things bothering me"  Patient Goals:  "Take medication properly, write down things bothering me"  Discharge Plan or Barriers: Pt will return to her mother's home and follow up with outpatient treatment.   Reason for Continuation of Hospitalization: Medication stabilization  Estimated Length of Stay: 5-7 days  Recreational Therapy: Patient Stressors: Family Patient Goal: Patient will successfully identify 2 ways of making healthy decisions post d/c within 5 recreation therapy group sessions  Attendees: Patient: Beth Berry 04/10/2018  3:14 PM  Physician: Mordecai Rasmussen MD 04/10/2018 3:14 PM  Nursing:  04/10/2018 3:14 PM  RN Care Manager: 04/10/2018 3:14 PM  Social Worker: Lowella Dandy LCSW Olivia Moton LCSW  04/10/2018 3:14 PM  Recreational Therapist: Garret Reddish LRT 04/10/2018 3:14 PM  Other:  04/10/2018 3:14 PM  Other:  04/10/2018 3:14 PM  Other: 04/10/2018 3:14 PM    Scribe for Treatment Team: Suzan Slick, LCSW 04/10/2018 3:14 PM

## 2018-04-10 NOTE — Plan of Care (Signed)
  Problem: Education: Goal: Knowledge of [facility] Education information/materials will improve Note:  Educated patient on Behavioral medicine  unit programing , verbalize understanding  Goal: Emotional status will improve Note:  Emotional and mental  status  improved  Goal: Verbalization of understanding the information provided will improve Note:  Information  given  in concrete for for better understanding    Problem: Coping: Goal: Coping ability will improve Note:  Working  on coping skills  Goal: Will verbalize feelings Note:  Attending unit programing , able to better verbalize  feeling    Problem: Safety: Goal: Ability to redirect hostility and anger into socially appropriate behaviors will improve Note:  No anger outburst  or hostility directed  at staff or peers  Goal: Ability to remain free from injury will improve Note:  Patient voice of no safety concerns

## 2018-04-10 NOTE — Progress Notes (Signed)
[facility]  04/10/2018 2:05 PM Beth Berry  MRN:  [id number] Subjective: Follow-up for this patient with schizoaffective disorder and PTSD.  Patient says she is feeling okay today.  Sleep last night was a little erratic but ultimately she was able to rest.  She denies any suicidal or homicidal ideation.  Denies hallucinations.  Denies paranoia.  For the most part behaving without any difficulty or behavior problems here on the unit and interacts reasonably well with other patients.  Patient is requesting that we consider discharge within the next day or so.  She would like to go to stay with her mother if possible.  He is tolerating medicine well. Principal Problem: Schizoaffective disorder, bipolar type (HCC) Diagnosis: Principal Problem:   Schizoaffective disorder, bipolar type (HCC) Active Problems:   PTSD (post-traumatic stress disorder)   Prediabetes  Total Time spent with patient: 30 minutes  Past Psychiatric History: Past history of previous hospitalization a month or so ago.  Has had some unstable behavior recently.  Denies any recent suicidality.  Past Medical History:  Past Medical History:  Diagnosis Date  . Bipolar 1 disorder (HCC)   . Contusion of great toe of right foot 06/11/2011  . Drug abuse (HCC)   . Schizophrenia [facility](HCC)     Past Surgical History:  Procedure Laterality Date  . DENTAL SURGERY     Family History:  Family History  Problem Relation Age of Onset  . Hypertension Mother   . Drug abuse Mother   . Drug abuse Father    Family Psychiatric  History: See above some substance abuse Social History:  Social History   Substance and Sexual Activity  Alcohol Use Yes   Comment: rarely     Social History   Substance and Sexual Activity  Drug Use Yes  . Types: Heroin, MDMA (Ecstacy), Cocaine   Comment: none now, hx of marijuana, narcotic, heroin, cocaine- last use 3 years    Social History   Socioeconomic History  . Marital status:  Significant Other    Spouse name: Not on file  . Number of children: Not on file  . Years of education: Not on file  . Highest education level: Not on file  Occupational History  . Not on file  Social Needs  . Financial resource strain: Not on file  . Food insecurity:    Worry: Not on file    Inability: Not on file  . Transportation needs:    Medical: Not on file    Non-medical: Not on file  Tobacco Use  . Smoking status: Current Some Day Smoker    Packs/day: 1.50    Types: Cigarettes  . Smokeless tobacco: Never Used  Substance and Sexual Activity  . Alcohol use: Yes    Comment: rarely  . Drug use: Yes    Types: Heroin, MDMA (Ecstacy), Cocaine    Comment: none now, hx of marijuana, narcotic, heroin, cocaine- last use 3 years  . Sexual activity: Yes    Birth control/protection: None  Lifestyle  . Physical activity:    Days per week: Not on file    Minutes per session: Not on file  . Stress: Not on file  Relationships  . Social connections:    Talks on phone: Not on file    Gets together: Not on file    Attends religious service: Not on file    Active member of club or organization: Not on file    Attends meetings of clubs or organizations:  Not on file    Relationship status: Not on file  Other Topics Concern  . Not on file  Social History Narrative  . Not on file   Additional Social History:    Pain Medications: See MAR Prescriptions: See MAR Over the Counter: See MAR History of alcohol / drug use?: No history of alcohol / drug abuse Negative Consequences of Use: Legal, Personal relationships                    Sleep: Fair  Appetite:  Fair  Current Medications: Current Facility-Administered Medications  Medication Dose Route Frequency Provider Last Rate Last Dose  . acetaminophen (TYLENOL) tablet 650 mg  650 mg Oral Q6H PRN Mariel Craft, MD      . alum & mag hydroxide-simeth (MAALOX/MYLANTA) 200-200-20 MG/5ML suspension 30 mL  30 mL Oral Q4H  PRN Mariel Craft, MD      . asenapine (SAPHRIS) sublingual tablet 10 mg  10 mg Sublingual BID Mariel Craft, MD   10 mg at 04/10/18 0815  . hydrocerin (EUCERIN) cream   Topical BID Edmundo Tedesco, Jackquline Denmark, MD      . hydrOXYzine (ATARAX/VISTARIL) tablet 25 mg  25 mg Oral BID Mariel Craft, MD   25 mg at 04/10/18 0815  . loratadine (CLARITIN) tablet 10 mg  10 mg Oral Daily Mariel Craft, MD   10 mg at 04/10/18 0815  . magnesium hydroxide (MILK OF MAGNESIA) suspension 30 mL  30 mL Oral Daily PRN Mariel Craft, MD      . meloxicam [facility]) tablet 15 mg  15 mg Oral Daily Mariel Craft, MD   15 mg at 04/10/18 8563  . norgestimate-ethinyl estradiol (ORTHO-CYCLEN,SPRINTEC,PREVIFEM) 0.25-35 MG-MCG tablet 1 tablet  1 tablet Oral Daily Shaneece Stockburger T, MD      . propranolol (INDERAL) tablet 10 mg  10 mg Oral BID Mariel Craft, MD   10 mg at 04/10/18 0815  . traZODone (DESYREL) tablet 50 mg  50 mg Oral QHS Mariel Craft, MD   50 mg at 04/09/18 2129    Lab Results:  Results for orders placed or performed during the hospital encounter of 04/06/18 (from the past 48 hour(s))  Hemoglobin A1c     Status: Abnormal   Collection Time: 04/08/18  6:00 PM  Result Value Ref Range   Hgb A1c MFr Bld 5.7 (H) 4.8 - 5.6 %    Comment: (NOTE) Pre diabetes:          5.7%-6.4% Diabetes:              >6.4% Glycemic control for   <7.0% adults with diabetes    Mean Plasma Glucose 116.89 mg/dL    Comment: Performed at [facility], [address], [place], [place] [address]  Lipid panel     Status: None   Collection Time: 04/08/18  6:00 PM  Result Value Ref Range   Cholesterol 143 0 - 200 mg/dL   Triglycerides 61 <263 mg/dL   HDL 58 >78 mg/dL   Total CHOL/HDL Ratio 2.5 RATIO   VLDL 12 0 - 40 mg/dL   LDL Cholesterol 73 0 - 99 mg/dL    Comment:        Total Cholesterol/HDL:CHD Risk Coronary Heart Disease Risk Table                     Men   Women  1/2 Average Risk   3.4  3.3  Average Risk        5.0   4.4  2 X Average Risk   9.6   7.1  3 X Average Risk  23.4   11.0        Use the calculated Patient Ratio above and the CHD Risk Table to determine the patient's CHD Risk.        ATP III CLASSIFICATION (LDL):  <100     mg/dL   Optimal  161-096  mg/dL   Near or Above                    Optimal  130-159  mg/dL   Borderline  045-409  mg/dL   High  >811     mg/dL   Very High Performed at [facility], [address] [place], [place] [address]   TSH     Status: None   Collection Time: 04/08/18  6:00 PM  Result Value Ref Range   TSH 0.613 0.350 - 4.500 uIU/mL    Comment: Performed by a 3rd Generation assay with a functional sensitivity of <=0.01 uIU/mL. Performed at [facility], [address] [place], [place] [address]     Blood Alcohol level:  Lab Results  Component Value Date   [facility] <10 04/06/2018   ETH <11 06/11/2011    Metabolic Disorder Labs: Lab Results  Component Value Date   HGBA1C 5.7 (H) 04/08/2018   MPG 116.89 04/08/2018   No results found for: PROLACTIN Lab Results  Component Value Date   CHOL 143 04/08/2018   TRIG 61 04/08/2018   HDL 58 04/08/2018   CHOLHDL 2.5 04/08/2018   VLDL 12 04/08/2018   LDLCALC 73 04/08/2018    Physical Findings: AIMS: Facial and Oral Movements Muscles of Facial Expression: None, normal Lips and Perioral Area: None, normal Jaw: None, normal Tongue: None, normal,Extremity Movements Upper (arms, wrists, hands, fingers): None, normal Lower (legs, knees, ankles, toes): None, normal, Trunk Movements Neck, shoulders, hips: None, normal, Overall Severity Severity of abnormal movements (highest score from questions above): None, normal Incapacitation due to abnormal movements: None, normal Patient's awareness of abnormal movements (rate only patient's report): No Awareness, Dental Status Current problems with teeth and/or dentures?: No Does patient usually wear dentures?: No  CIWA:     COWS:     Musculoskeletal: Strength & Muscle Tone: within normal limits Gait & Station: normal Patient leans: N/A  Psychiatric Specialty Exam: Physical Exam  Nursing note and vitals reviewed. Constitutional: She appears well-developed and well-nourished.  HENT:  Head: Normocephalic and atraumatic.  Eyes: Pupils are equal, round, and reactive to light. Conjunctivae are normal.  Neck: Normal range of motion.  Cardiovascular: Regular rhythm and normal heart sounds.  Respiratory: Effort normal.  GI: Soft.  Musculoskeletal: Normal range of motion.  Neurological: She is alert.  Skin: Skin is warm and dry.  Psychiatric: She has a normal mood and affect. Her speech is normal and behavior is normal. Thought content normal. Cognition and memory are normal. She expresses impulsivity.    Review of Systems  Constitutional: Negative.   HENT: Negative.   Eyes: Negative.   Respiratory: Negative.   Cardiovascular: Negative.   Gastrointestinal: Negative.   Musculoskeletal: Negative.   Skin: Negative.   Neurological: Negative.   Psychiatric/Behavioral: Negative for depression, hallucinations, memory loss, substance abuse and suicidal ideas. The patient is not nervous/anxious and does not have insomnia.     Blood pressure (!) 145/93, pulse 70,  temperature 97.7 F (36.5 C), temperature source Oral, resp. rate 18, height 5\' 7"  (1.702 m), weight 98.4 kg, last menstrual period 04/08/2018, SpO2 98 %.Body mass index is 33.99 kg/m.  General Appearance: Casual  Eye Contact:  Good  Speech:  Clear and Coherent  Volume:  Normal  Mood:  Euthymic  Affect:  Congruent  Thought Process:  Goal Directed  Orientation:  Full (Time, Place, and Person)  Thought Content:  Logical  Suicidal Thoughts:  No  Homicidal Thoughts:  No  Memory:  Immediate;   Fair Recent;   Fair Remote;   Fair  Judgement:  Fair  Insight:  Fair  Psychomotor Activity:  Normal  Concentration:  Concentration: Fair  Recall:  [employer] of Knowledge:  Fair  Language:  Fair  Akathisia:  No  Handed:  Right  AIMS (if indicated):     Assets:  Desire for Improvement Housing Physical Health Resilience  ADL's:  Intact  Cognition:  WNL  Sleep:  Number of Hours: 7.45     Treatment Plan Summary: Daily contact with patient to assess and evaluate symptoms and progress in treatment, Medication management and Plan Patient might still be slightly hypomanic but not psychotic as far as I can tell and is tolerating medicine well.  I will try to reach her mother to see if that is a reasonable discharge plan.  We discussed today in morning meeting the difficulty she will have getting her car back.  We still however may be able to look at discharge within the next 1 to 2 days.  No change in medicine for today.  Mordecai Rasmussen, MD 04/10/2018, 2:05 PM

## 2018-04-10 NOTE — Progress Notes (Signed)
Recreation Therapy Notes  Date: 04/10/2018  Time: 9:30 am  Location: Craft Room  Behavioral response: Appropriate  Intervention Topic: Communication   Discussion/Intervention:  Group content today was focused on communication. The group defined communication and ways to communicate with others. Individuals stated reason why communication is important and some reasons to communicate with others. Patients expressed if they thought they were good at communicating with others and ways they could improve their communication skills. The group identified important parts of communication and some experiences they have had in the past with communication. The group participated in the intervention "What is that?", where they had a chance to test out their communication skills and identify ways to improve their communication techniques.  Clinical Observations/Feedback:  Patient came to group late due to unknown reasons. [name].  Harris Kistler LRT/CTRS        Chanelle Hodsdon 04/10/2018 11:51 AM

## 2018-04-10 NOTE — [facility] Group Notes (Signed)
LCSW Group Therapy Note  04/10/2018 1:00 PM  Type of Therapy/Topic:  Group Therapy:  Emotion Regulation  Participation Level:  Active   Description of Group:   The purpose of this group is to assist patients in learning to regulate negative emotions and experience positive emotions. Patients will be guided to discuss ways in which they have been vulnerable to their negative emotions. These vulnerabilities will be juxtaposed with experiences of positive emotions or situations, and patients will be challenged to use positive emotions to combat negative ones. Special emphasis will be placed on coping with negative emotions in conflict situations, and patients will process healthy conflict resolution skills.  Therapeutic Goals: 1. Patient will identify two positive emotions or experiences to reflect on in order to balance out negative emotions 2. Patient will label two or more emotions that they find the most difficult to experience 3. Patient will demonstrate positive conflict resolution skills through discussion and/or role plays  Summary of Patient Progress: Patient was present and active participant in group.  Patient identified the following as negative emotions that she has felt recently "anxious, insecurity and aggravated".  Patient identified the following as positive emotions she has felt recently "appreciation and faith".  Patient was supportive of other group members and engaged in discussion on effective coping skills, identifying some of the following "music, nature, self-reflection, exercise, gardening, fishing, massage/body relaxation and deep breathing".    Therapeutic Modalities:   Cognitive Behavioral Therapy Feelings Identification Dialectical Behavioral Therapy  Penni Homans, MSW, LCSW 04/10/2018 2:38 PM

## 2018-04-11 MED ORDER — PROPRANOLOL HCL 10 MG PO TABS: 10.0000 mg | ORAL_TABLET | Freq: Two times a day (BID) | ORAL | 0 refills | Status: DC

## 2018-04-11 MED ORDER — MELOXICAM 15 MG PO TABS: 15.0000 mg | ORAL_TABLET | Freq: Every day | ORAL | 0 refills | Status: DC

## 2018-04-11 MED ORDER — ASENAPINE MALEATE 5 MG SL SUBL: 10.0000 mg | SUBLINGUAL_TABLET | Freq: Two times a day (BID) | SUBLINGUAL | 0 refills | Status: DC

## 2018-04-11 NOTE — Plan of Care (Signed)
Patient is alert and oriented x 4 stable and maintaining safety in the unit, no aggressive behaviors , medication compliance and take her medications regularly with out any side effects , patient is pleasant and engaging upon approach.  Patient denies any SI/HI/AVH , responding well to treatment regimen and complying with stated plan of care, patient contract for safety, speech content  is appropriate and coherent, appetite is good and sleep is long with out any interruptions, patient voice no concerns at this time.  Precautionary 15 minute observation checks were completed for safety . Patient was provided with education but needs reinforcements , group participation is poor. Patient is encouraged to attend groups with peers and participate in unit activities. No distress noted.   Problem: Education: Goal: Knowledge of [facility] Education information/materials will improve Outcome: Progressing Goal: Emotional status will improve Outcome: Progressing Goal: Mental status will improve Outcome: Progressing Goal: Verbalization of understanding the information provided will improve Outcome: Progressing   Problem: Coping: Goal: Coping ability will improve Outcome: Progressing Goal: Will verbalize feelings Outcome: Progressing   Problem: Safety: Goal: Ability to redirect hostility and anger into socially appropriate behaviors will improve Outcome: Progressing Goal: Ability to remain free from injury will improve Outcome: Progressing

## 2018-04-11 NOTE — Progress Notes (Signed)
[facility] MD Progress Note  04/11/2018 5:01 PM Beth Berry  MRN:  [id number] Subjective: Follow-up for this patient with schizoaffective disorder.  Patient is still hyperverbal but is interruptible.  Somewhat flighty in her thinking but nothing bizarre or agitated no thoughts of suicide no threats of violence.  Appears to be taking her medicine and tolerating it without difficulty. Principal Problem: Schizoaffective disorder, bipolar type (HCC) Diagnosis: Principal Problem:   Schizoaffective disorder, bipolar type (HCC) Active Problems:   PTSD (post-traumatic stress disorder)   Prediabetes  Total Time spent with patient: 20 minutes  Past Psychiatric History: Past history of schizoaffective or bipolar disorder multiple prior hospitalizations including at the beginning of January.  Apparent history of medicine noncompliance  Past Medical History:  Past Medical History:  Diagnosis Date  . Bipolar 1 disorder (HCC)   . Contusion of great toe of right foot 06/11/2011  . Drug abuse (HCC)   . Schizophrenia [facility])     Past Surgical History:  Procedure Laterality Date  . DENTAL SURGERY     Family History:  Family History  Problem Relation Age of Onset  . Hypertension Mother   . Drug abuse Mother   . Drug abuse Father    Family Psychiatric  History: Substance abuse Social History:  Social History   Substance and Sexual Activity  Alcohol Use Yes   Comment: rarely     Social History   Substance and Sexual Activity  Drug Use Yes  . Types: Heroin, MDMA (Ecstacy), Cocaine   Comment: none now, hx of marijuana, narcotic, heroin, cocaine- last use 3 years    Social History   Socioeconomic History  . Marital status: Significant Other    Spouse name: Not on file  . Number of children: Not on file  . Years of education: Not on file  . Highest education level: Not on file  Occupational History  . Not on file  Social Needs  . Financial resource strain: Not on file  . Food insecurity:     Worry: Not on file    Inability: Not on file  . Transportation needs:    Medical: Not on file    Non-medical: Not on file  Tobacco Use  . Smoking status: Current Some Day Smoker    Packs/day: 1.50    Types: Cigarettes  . Smokeless tobacco: Never Used  Substance and Sexual Activity  . Alcohol use: Yes    Comment: rarely  . Drug use: Yes    Types: Heroin, MDMA (Ecstacy), Cocaine    Comment: none now, hx of marijuana, narcotic, heroin, cocaine- last use 3 years  . Sexual activity: Yes    Birth control/protection: None  Lifestyle  . Physical activity:    Days per week: Not on file    Minutes per session: Not on file  . Stress: Not on file  Relationships  . Social connections:    Talks on phone: Not on file    Gets together: Not on file    Attends religious service: Not on file    Active member of club or organization: Not on file    Attends meetings of clubs or organizations: Not on file    Relationship status: Not on file  Other Topics Concern  . Not on file  Social History Narrative  . Not on file   Additional Social History:    Pain Medications: See MAR Prescriptions: See MAR Over the Counter: See MAR History of alcohol / drug use?: No history of alcohol /  drug abuse Negative Consequences of Use: Legal, Personal relationships                    Sleep: Fair  Appetite:  Fair  Current Medications: Current Facility-Administered Medications  Medication Dose Route Frequency Provider Last Rate Last Dose  . acetaminophen (TYLENOL) tablet 650 mg  650 mg Oral Q6H PRN [name], [name], MD      . alum & mag hydroxide-simeth (MAALOX/MYLANTA) 200-200-20 MG/5ML suspension 30 mL  30 mL Oral Q4H PRN [name], [name], MD      . asenapine (SAPHRIS) sublingual tablet 10 mg  10 mg Sublingual BID [name], [name], MD   10 mg at 04/11/18 [id number]  . hydrocerin (EUCERIN) cream   Topical BID [name], [name], MD      . hydrOXYzine (ATARAX/VISTARIL) tablet 25 mg  25 mg Oral BID  [name], [name], MD   25 mg at 04/11/18 0700  . loratadine (CLARITIN) tablet 10 mg  10 mg Oral Daily [name], [name], MD   10 mg at 04/11/18 0817  . magnesium hydroxide (MILK OF MAGNESIA) suspension 30 mL  30 mL Oral Daily PRN [name], [name], MD      . meloxicam [facility](MOBIC) tablet 15 mg  15 mg Oral Daily [name], [name], MD   15 mg at 04/11/18 [id number]  . norgestimate-ethinyl estradiol (ORTHO-CYCLEN,SPRINTEC,PREVIFEM) 0.25-35 MG-MCG tablet 1 tablet  1 tablet Oral Daily [name], MD      . propranolol (INDERAL) tablet 10 mg  10 mg Oral BID [name], [name], MD   10 mg at 04/11/18 0816  . traZODone (DESYREL) tablet 50 mg  50 mg Oral QHS [name], [name], MD   50 mg at 04/10/18 2156    Lab Results: No results found for this or any previous visit (from the past 48 hour(s)).  Blood Alcohol level:  Lab Results  Component Value Date   [facility] 04/06/2018   ETH <11 06/11/2011    Metabolic Disorder Labs: Lab Results  Component Value Date   HGBA1C 5.7 (H) 04/08/2018   MPG 116.89 04/08/2018   No results found for: PROLACTIN Lab Results  Component Value Date   CHOL 143 04/08/2018   TRIG 61 04/08/2018   HDL 58 04/08/2018   CHOLHDL 2.5 04/08/2018   VLDL 12 04/08/2018   LDLCALC 73 04/08/2018    Physical Findings: AIMS: Facial and Oral Movements Muscles of Facial Expression: None, normal Lips and Perioral Area: None, normal Jaw: None, normal Tongue: None, normal,Extremity Movements Upper (arms, wrists, hands, fingers): None, normal Lower (legs, knees, ankles, toes): None, normal, Trunk Movements Neck, shoulders, hips: None, normal, Overall Severity Severity of abnormal movements (highest score from questions above): None, normal Incapacitation due to abnormal movements: None, normal Patient's awareness of abnormal movements (rate only patient's report): No Awareness, Dental Status Current problems with teeth and/or dentures?: No Does patient usually wear dentures?: No  CIWA:     COWS:     Musculoskeletal: Strength & Muscle Tone: within normal limits Gait & Station: normal Patient leans: N/A  Psychiatric Specialty Exam: Physical Exam  Nursing note and vitals reviewed. Constitutional: She appears well-developed and well-nourished.  HENT:  Head: Normocephalic and atraumatic.  Eyes: Pupils are equal, round, and reactive to light. Conjunctivae are normal.  Neck: Normal range of motion.  Cardiovascular: Regular rhythm and normal heart sounds.  Respiratory: Effort normal. No respiratory distress.  GI: Soft.  Musculoskeletal: Normal range of motion.  Neurological: She  is alert.  Skin: Skin is warm and dry.  Psychiatric: Thought content normal. Her affect is labile. Her speech is tangential. She is agitated. She is not aggressive. Cognition and memory are normal. She expresses impulsivity.    Review of Systems  Constitutional: Negative.   HENT: Negative.   Eyes: Negative.   Respiratory: Negative.   Cardiovascular: Negative.   Gastrointestinal: Negative.   Musculoskeletal: Negative.   Skin: Negative.   Neurological: Negative.   Psychiatric/Behavioral: Negative.     Blood pressure (!) 142/80, pulse 80, temperature 98.2 F (36.8 C), temperature source Oral, resp. rate 18, height 5\' 7"  (1.702 m), weight 98.4 kg, last menstrual period 04/08/2018, SpO2 98 %.Body mass index is 33.99 kg/m.  General Appearance: Disheveled  Eye Contact:  Fair  Speech:  Pressured  Volume:  Increased  Mood:  Euthymic  Affect:  Congruent  Thought Process:  Coherent  Orientation:  Full (Time, Place, and Person)  Thought Content:  Rumination  Suicidal Thoughts:  No  Homicidal Thoughts:  No  Memory:  Immediate;   Fair Recent;   Fair Remote;   Fair  Judgement:  Fair  Insight:  Fair  Psychomotor Activity:  Decreased  Concentration:  Concentration: Fair  Recall:  FiservFair  Fund of Knowledge:  Fair  Language:  Fair  Akathisia:  No  Handed:  Right  AIMS (if indicated):      Assets:  Desire for Improvement Physical Health Resilience Social Support  ADL's:  Intact  Cognition:  WNL  Sleep:  Number of Hours: 6.15     Treatment Plan Summary: Daily contact with patient to assess and evaluate symptoms and progress in treatment, Medication management and Plan Still hypomanic but it is possible that this is pretty close to her baseline.  Not acutely suicidal.  I understand her mother is concerned that the patient is still at risk for problematic behavior but at this point we are probably not going to see much improvement in the hospital.  Supportive counseling with her likely discharge within the next 1 to 2 days.  Mordecai RasmussenJohn Kaylon Hitz, MD 04/11/2018, 5:01 PM
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
## 2018-04-11 NOTE — Plan of Care (Signed)
Patient had no complaints on shift. She refused to take her trazodone because she states it knocks her out for days. She was logical when engaged in conversation, she spent most of the evening in the dayroom with peers. No issues to report on shift at this time.

## 2018-04-11 NOTE — Progress Notes (Signed)
Recreation Therapy Notes  Date: 04/11/2018  Time: 9:30 am  Location: Craft Room  Behavioral response: Appropriate  Intervention Topic: Team work  Discussion/Intervention:  Group content on today was focused on teamwork. The group identified what teamwork is. Individuals described who is a part of their team. Patients expressed why they thought teamwork is important. The group stated reasons why they thought it was easier to work with a Comptrollersmaller/larger team. Individuals discussed some positives and negatives of working with a team. Patients gave examples of past experiences they had while working with a team. The group participated in the intervention "Story in a bag", patients were in groups and were able to test their skill in a team setting.  Clinical Observations/Feedback:  Patient came to group and stated there is no I in team work. She explained that team work is structured. Participant described her work setting as team work. She expressed that some negative aspects of team work is placing blame on others. [name].  Latarsha Zani LRT/CTRS         Arriana Lohmann 04/11/2018 11:02 AM

## 2018-04-11 NOTE — [facility] Suicide Risk Assessment (Signed)
[facility] INPATIENT:  Family/Significant Other Suicide Prevention Education  Suicide Prevention Education:  Contact Attempts: Gerarda FractionMelissa Anderson, friend [phone] has been identified by the patient as the family member/significant other with whom the patient will be residing, and identified as the person(s) who will aid the patient in the event of a mental health crisis.  With written consent from the patient, two attempts were made to provide suicide prevention education, prior to and/or following the patient's discharge.  We were unsuccessful in providing suicide prevention education.  A suicide education pamphlet was given to the patient to share with family/significant other.  Date and time of first attempt: 04/11/18 1058am mailbox full unable to leave vm Date and time of second attempt:  Beth Berry 04/11/2018, 10:58 AM

## 2018-04-11 NOTE — Plan of Care (Signed)
  Problem: Education: Goal: Emotional status will improve Outcome: Progressing   Problem: Education: Goal: Mental status will improve Outcome: Progressing   Problem: Education: Goal: Verbalization of understanding the information provided will improve Outcome: Progressing   Problem: Coping: Goal: Coping ability will improve Outcome: Progressing   Problem: Coping: Goal: Will verbalize feelings Outcome: Progressing   Problem: Safety: Goal: Ability to redirect hostility and anger into socially appropriate behaviors will improve Outcome: Progressing   Problem: Safety: Goal: Ability to remain free from injury will improve Outcome: Progressing
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
## 2018-04-11 NOTE — Progress Notes (Signed)
D: Patient is alert and oriented. Pt is pleasant and cooperative and has appropriate affect. Pt shared that she slept well and is having a good morning. Pt denies anxiety, thoughts of harming self, or thoughts of harming others. Pt shared that she had a negative telephone conversation with her cousin this morning, but she reports this was helpful for making plans after discharge. Pt states she needs "to be around people who love her and she loves" rather than people who "pop her balloon". Pt is actively attending groups. Pt denies pain or discomfort. Pt contracts for safety and verbalizes understanding of how to voice concerns or needs. No distress is noted.   A: Medications are administered per MD orders. Support and encouragement are offered. Pt encouraged to voice concerns.   R: No adverse medication reactions. Pt contracts for safety. Pt compliant with treatment plan. Pt engaging in positive and appropriate interactions with staff and peers. Will continue to monitor.

## 2018-04-11 NOTE — [facility] Group Notes (Signed)
Balance In Life 04/11/2018 1PM  Type of Therapy/Topic:  Group Therapy:  Balance in Life  Participation Level:  Active  Description of Group:   This group will address the concept of balance and how it feels and looks when one is unbalanced. Patients will be encouraged to process areas in their lives that are out of balance and identify reasons for remaining unbalanced. Facilitators will guide patients in utilizing problem-solving interventions to address and correct the stressor making their life unbalanced. Understanding and applying boundaries will be explored and addressed for obtaining and maintaining a balanced life. Patients will be encouraged to explore ways to assertively make their unbalanced needs known to significant others in their lives, using other group members and facilitator for support and feedback.  Therapeutic Goals: 1. Patient will identify two or more emotions or situations they have that consume much of in their lives. 2. Patient will identify signs/triggers that life has become out of balance:  3. Patient will identify two ways to set boundaries in order to achieve balance in their lives:  4. Patient will demonstrate ability to communicate their needs through discussion and/or role plays  Summary of Patient Progress: Pt actively and appropriately engaged in today's group session. 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 Pt was receptive to feedback from group members and respected boundaries during session. Pt demonstrated good insight.  Therapeutic Modalities:   Cognitive Behavioral Therapy Solution-Focused Therapy Assertiveness Training  Simrit Gohlke Philip Aspen, LCSW

## 2018-04-11 NOTE — Progress Notes (Signed)
Recreation Therapy Notes  INPATIENT RECREATION THERAPY ASSESSMENT  Patient Details Name: Nydia Iorio MRN: [id number] DOB: [birth year] Today's Date: 04/11/2018       Information Obtained From: Patient  Able to Participate in Assessment/Interview: Yes  Patient Presentation: Responsive  Reason for Admission (Per Patient): Active Symptoms, Impulsive Behavior  Patient Stressors: Family  Coping Skills:   Avoidance, Journal, Write, Talk  Leisure Interests (2+):  Sports - Swimming, Garment/textile technologist - Technical brewer of Recreation/Participation:    Biochemist, clinical Resources:  Yes  Community Resources:  Gym  Current Use:    If no, Barriers?:    Expressed Interest in [employer] Information:    [employer] of Residence:  [employer] [facility]	[place]  Patient Main Form of Transportation: Set designer  Patient Strengths:  Respectful, loyal, kind, giving  Patient Identified Areas of Improvement:  Listen more and have faith  Patient Goal for Hospitalization:  take medication properly and write things down that are bothering me  Current SI (including self-harm):  No  Current HI:  No  Current AVH: No  Staff Intervention Plan: Group Attendance, Collaborate with Interdisciplinary Treatment Team  Consent to Intern Participation: N/A  Jaxtin Raimondo 04/11/2018, 2:47 PM

## 2018-04-12 MED ORDER — MELOXICAM 15 MG PO TABS: 15.0000 mg | ORAL_TABLET | Freq: Every day | ORAL | 1 refills | Status: DC

## 2018-04-12 MED ORDER — NORGESTIMATE-ETH ESTRADIOL 0.25-35 MG-MCG PO TABS: 1.0000 | ORAL_TABLET | Freq: Every day | ORAL | 1 refills | Status: AC

## 2018-04-12 MED ORDER — LORATADINE 10 MG PO TABS: 10.0000 mg | ORAL_TABLET | Freq: Every day | ORAL | 1 refills | Status: AC

## 2018-04-12 MED ORDER — PROPRANOLOL HCL 10 MG PO TABS: 10.0000 mg | ORAL_TABLET | Freq: Two times a day (BID) | ORAL | 1 refills | Status: DC

## 2018-04-12 MED ORDER — TRAZODONE HCL 50 MG PO TABS: 50.0000 mg | ORAL_TABLET | Freq: Every day | ORAL | 1 refills | Status: DC

## 2018-04-12 MED ORDER — GUAIFENESIN-DM 100-10 MG/5ML PO SYRP
5.0000 mL | ORAL_SOLUTION | ORAL | Status: DC | PRN
  Administered 2018-04-12 (×2): 5 mL via ORAL
  Filled 2018-04-12 (×3): qty 5

## 2018-04-12 MED ORDER — ASENAPINE MALEATE 5 MG SL SUBL: 10.0000 mg | SUBLINGUAL_TABLET | Freq: Two times a day (BID) | SUBLINGUAL | 1 refills | Status: DC

## 2018-04-12 MED ORDER — HYDROXYZINE HCL 25 MG PO TABS: 25.0000 mg | ORAL_TABLET | Freq: Two times a day (BID) | ORAL | 1 refills | Status: DC

## 2018-04-12 NOTE — Progress Notes (Signed)
Recreation Therapy Notes  Date: 04/12/2018  Time: 9:30 am  Location: Craft Room  Behavioral response: Appropriate  Intervention Topic: Emotions  Discussion/Intervention:  Group content on today was focused on emotions. The group identified what emotions are and why it is important to have emotions. Patients expressed some positive and negative emotions. Individuals gave some past experiences on how they normally dealt with emotions in the past. The group described some positive ways to deal with emotions in the future. Patients participated in the intervention "Name the [name]" where individuals were given a chance to experience different emotions.  Clinical Observations/Feedback:  [name]. [name]. [name]. [name]. [name].  Beth Berry LRT/CTRS         Lisandro Meggett 04/12/2018 11:27 AM

## 2018-04-12 NOTE — BHH Suicide Risk Assessment (Signed)
[facility] INPATIENT:  Family/Significant Other Suicide Prevention Education  Suicide Prevention Education:  Education Completed; Beth Berry, mother [phone] has been identified by the patient as the family member/significant other with whom the patient will be residing, and identified as the person(s) who will aid the patient in the event of a mental health crisis (suicidal ideations/suicide attempt).  With written consent from the patient, the family member/significant other has been provided the following suicide prevention education, prior to the and/or following the discharge of the patient.  The suicide prevention education provided includes the following:  Suicide risk factors  Suicide prevention and interventions  National Suicide Hotline telephone number  [facility] assessment telephone number  [facility] Emergency Assistance 911  [facility] and/or Residential Mobile Crisis Unit telephone number  Request made of family/significant other to:  Remove weapons (e.g., guns, rifles, knives), all items previously/currently identified as safety concern.    Remove drugs/medications (over-the-counter, prescriptions, illicit drugs), all items previously/currently identified as a safety concern.  The family member/significant other verbalizes understanding of the suicide prevention education information provided.  The family member/significant other agrees to remove the items of safety concern listed above. Ms. Beth Berry reports she does not feel pt is stable enough to be discharged on 2/15. She says when she visited pt she appeared to still be manic. Ms. Beth Berry says she will contact physician to address her medical questions/concerns. She says the pt will return to her home, where she has no access to guns or weapons. Ms. Beth Berry reports the pt's car will be retrieved today but she is not going to allow the pt to drive it. She states she will also be monitoring pt to make sure she  takes her medication. CSW informed her if pt needs further medical attention to call 911 or bring to the nearest emergency department.  Beth Berry Beth Berry 04/12/2018, 11:21 AM

## 2018-04-12 NOTE — Progress Notes (Signed)
Recreation Therapy Notes  INPATIENT RECREATION TR PLAN  Patient Details Name: Beth Berry MRN: [id number] DOB: 08-08-1975 Today's Date: 04/12/2018  Rec Therapy Plan Is patient appropriate for Therapeutic Recreation?: Yes Treatment times per week: at least 3 Estimated Length of Stay: 5-7 days TR Treatment/Interventions: Group participation (Comment)  Discharge Criteria Pt will be discharged from therapy if:: Discharged Treatment plan/goals/alternatives discussed and agreed upon by:: Patient/family  Discharge Summary Short term goals set: Patient will successfully identify 2 ways of making healthy decisions post d/c within 5 recreation therapy group sessions Short term goals met: Complete Progress toward goals comments: Groups attended Which groups?: Other (Comment), Communication(Emotions, Team work) Reason goals not met: N/A Therapeutic equipment acquired: N/A Reason patient discharged from therapy: Discharge from hospital Pt/family agrees with progress & goals achieved: Yes Date patient discharged from therapy: 04/12/18   Beth Berry 04/12/2018, 3:50 PM

## 2018-04-12 NOTE — B[facility] Group Notes (Signed)
LCSW Group Therapy Note  04/12/2018 1:00 PM  Type of Therapy and Topic:  Group Therapy:  Feelings around Relapse and Recovery  Participation Level:  Active   Description of Group:    Patients in this group will discuss emotions they experience before and after a relapse. They will process how experiencing these feelings, or avoidance of experiencing them, relates to having a relapse. Facilitator will guide patients to explore emotions they have related to recovery. Patients will be encouraged to process which emotions are more powerful. They will be guided to discuss the emotional reaction significant others in their lives may have to their relapse or recovery. Patients will be assisted in exploring ways to respond to the emotions of others without this contributing to a relapse.  Therapeutic Goals: 1. Patient will identify two or more emotions that lead to a relapse for them 2. Patient will identify two emotions that result when they relapse 3. Patient will identify two emotions related to recovery 4. Patient will demonstrate ability to communicate their needs through discussion and/or role plays   Summary of Patient Progress: Patient was present and attentive in group.  Patient was talkative and required redirection several times.  Patient was able to redirected when needed.  Patient shared the following emotions surrounding relapse "anxiety, lack of self-worth, wanting to fit in and peer pressure".  Patient engaged in discussion on coping skills, identifying such skills as: positive reinforcement, TV, exercise, music, friends, date night, gratitude, mindfulness, taking medication, spending time with kids and coloring.   Therapeutic Modalities:   Cognitive Behavioral Therapy Solution-Focused Therapy Assertiveness Training Relapse Prevention Therapy   Penni Homans, MSW, LCSW 04/12/2018 2:18 PM

## 2018-04-12 NOTE — Plan of Care (Signed)
Patient's affect had improved since admission. Less mania being displayed with clear and precise thought content noted. Present in the milieu this shift. Attends group with active and appropriate interaction. Had developed a plan for discharged that includes [facility] and take her to [place] to put gas in her car. She will then go back to [place] with her mom until she leaves for [place] on the 17th of February. Denies SI/HI/AVH and pain. Will continue to monitor q 15 minutes for safety.

## 2018-04-12 NOTE — BHH Suicide Risk Assessment (Signed)
[facility] Discharge Suicide Risk Assessment   Principal Problem: Schizoaffective disorder, bipolar type [facility]) Discharge Diagnoses: Principal Problem:   Schizoaffective disorder, bipolar type (HCC) Active Problems:   PTSD (post-traumatic stress disorder)   Prediabetes   Total Time spent with patient: 45 minutes  Musculoskeletal: Strength & Muscle Tone: within normal limits Gait & Station: normal Patient leans: N/A  Psychiatric Specialty Exam: ROS  Blood pressure (!) 144/85, pulse 79, temperature 98.1 F (36.7 C), temperature source Oral, resp. rate 17, height 5\' 7"  (1.702 m), weight 98.4 kg, last menstrual period 04/08/2018, SpO2 99 %.Body mass index is 33.99 kg/m.  General Appearance: Casual  Eye Contact::  Fair  Speech:  Clear and Coherent409  Volume:  Normal  Mood:  Euthymic  Affect:  Congruent  Thought Process:  Coherent  Orientation:  Full (Time, Place, and Person)  Thought Content:  Logical  Suicidal Thoughts:  No  Homicidal Thoughts:  No  Memory:  Immediate;   Fair Recent;   Fair Remote;   Fair  Judgement:  Fair  Insight:  Fair  Psychomotor Activity:  Normal  Concentration:  Fair  Recall:  [employer] of Knowledge:Fair  Language: Fair  Akathisia:  No  Handed:  Right  AIMS (if indicated):     Assets:  Desire for Improvement Housing Physical Health Resilience  Sleep:  Number of Hours: 6.5  Cognition: WNL  ADL's:  Intact   Mental Status Per Nursing Assessment::   On Admission:  NA  Demographic Factors:  Gay, lesbian, or bisexual orientation  Loss Factors: Financial problems/change in socioeconomic status  Historical Factors: Impulsivity  Risk Reduction Factors:   Sense of responsibility to family, Positive social support and Positive therapeutic relationship  Continued Clinical Symptoms:  Bipolar Disorder:   Mixed State  Cognitive Features That Contribute To Risk:  Closed-mindedness    Suicide Risk:  Minimal: No identifiable suicidal ideation.   Patients presenting with no risk factors but with morbid ruminations; may be classified as minimal risk based on the severity of the depressive symptoms  Follow-up Information    [facility],a facility of [facility]. Schedule an appointment as soon as possible for a visit on 04/15/2018.   Why:  Please follow up with Myriam Jacobson at [facility] on Monday 04/15/18 at 5:00pm. She will then schedule you with Dr. Dayton Scrape. Please bring your discharge paperwork from this visit with you to your appointment.  Thank you Contact information: [address] [place], [place] [address] Phone: [phone]          Plan Of Care/Follow-up recommendations:  Activity:  as tolerated Diet:  regular Other:  follow up outpatient treatment  Mordecai Rasmussen, MD 04/12/2018, 12:18 PM

## 2018-04-12 NOTE — Discharge Summary (Signed)
Physician Discharge Summary Note  Patient:  Beth Berry is an 43 y.o., female MRN:  [id number] DOB:  Nov 01, 1975 Patient phone:  [phone] (home)  Patient address:   [address] [place] [address],  Total Time spent with patient: 45 minutes  Date of Admission:  04/09/2018 Date of Discharge: April 12, 2018  Reason for Admission: Patient was admitted through the emergency room where she presented with manic psychosis in [place].  Transferred to [place] for further treatment.  Patient was agitated psychotic and showing symptoms of mania.  Principal Problem: Schizoaffective disorder, bipolar type [facility]) Discharge Diagnoses: Principal Problem:   Schizoaffective disorder, bipolar type (HCC) Active Problems:   PTSD (post-traumatic stress disorder)   Prediabetes   Past Psychiatric History: Patient has a history of bipolar disorder had had recent hospitalizations within the last month in [place].  Has been on multiple mood stabilizers in the past  Past Medical History:  Past Medical History:  Diagnosis Date  . Bipolar 1 disorder (HCC)   . Contusion of great toe of right foot 06/11/2011  . Drug abuse (HCC)   . Schizophrenia [facility])     Past Surgical History:  Procedure Laterality Date  . DENTAL SURGERY     Family History:  Family History  Problem Relation Age of Onset  . Hypertension Mother   . Drug abuse Mother   . Drug abuse Father    Family Psychiatric  History: None known Social History:  Social History   Substance and Sexual Activity  Alcohol Use Yes   Comment: rarely     Social History   Substance and Sexual Activity  Drug Use Yes  . Types: Heroin, MDMA (Ecstacy), Cocaine   Comment: none now, hx of marijuana, narcotic, heroin, cocaine- last use 3 years    Social History   Socioeconomic History  . Marital status: Significant Other    Spouse name: Not on file  . Number of children: Not on file  . Years of education: Not on file  . Highest education  level: Not on file  Occupational History  . Not on file  Social Needs  . Financial resource strain: Not on file  . Food insecurity:    Worry: Not on file    Inability: Not on file  . Transportation needs:    Medical: Not on file    Non-medical: Not on file  Tobacco Use  . Smoking status: Current Some Day Smoker    Packs/day: 1.50    Types: Cigarettes  . Smokeless tobacco: Never Used  Substance and Sexual Activity  . Alcohol use: Yes    Comment: rarely  . Drug use: Yes    Types: Heroin, MDMA (Ecstacy), Cocaine    Comment: none now, hx of marijuana, narcotic, heroin, cocaine- last use 3 years  . Sexual activity: Yes    Birth control/protection: None  Lifestyle  . Physical activity:    Days per week: Not on file    Minutes per session: Not on file  . Stress: Not on file  Relationships  . Social connections:    Talks on phone: Not on file    Gets together: Not on file    Attends religious service: Not on file    Active member of club or organization: Not on file    Attends meetings of clubs or organizations: Not on file    Relationship status: Not on file  Other Topics Concern  . Not on file  Social History Narrative  .  Not on file    Hospital Course: Patient was admitted to the psychiatric ward.  15-minute checks in place.  Patient was started on Saphris for bipolar disorder.  Has been compliant since being in the hospital.  Has not displayed any suicidal or violent behaviors.  Has not been threatening or aggressive.  We have made contact with the patient's mother who is aware of the patient's condition.  Patient is sleeping better.  Not showing overt signs of psychosis.  Agrees to outpatient treatment in the community.  At this point appears to not meet commitment criteria and is requesting discharge.  Patient is being discharged today to the custody of her mother who will be making an effort to keep the patient on medication.  Referral information will be given about  outpatient treatment in the community in [place]  Physical Findings: AIMS: Facial and Oral Movements Muscles of Facial Expression: None, normal Lips and Perioral Area: None, normal Jaw: None, normal Tongue: None, normal,Extremity Movements Upper (arms, wrists, hands, fingers): None, normal Lower (legs, knees, ankles, toes): None, normal, Trunk Movements Neck, shoulders, hips: None, normal, Overall Severity Severity of abnormal movements (highest score from questions above): None, normal Incapacitation due to abnormal movements: None, normal Patient's awareness of abnormal movements (rate only patient's report): No Awareness, Dental Status Current problems with teeth and/or dentures?: No Does patient usually wear dentures?: No  CIWA:    COWS:     Musculoskeletal: Strength & Muscle Tone: within normal limits Gait & Station: normal Patient leans: N/A  Psychiatric Specialty Exam: Physical Exam  Nursing note and vitals reviewed. Constitutional: She appears well-developed and well-nourished.  HENT:  Head: Normocephalic and atraumatic.  Eyes: Pupils are equal, round, and reactive to light. Conjunctivae are normal.  Neck: Normal range of motion.  Cardiovascular: Regular rhythm and normal heart sounds.  Respiratory: Effort normal. No respiratory distress.  GI: Soft.  Musculoskeletal: Normal range of motion.  Neurological: She is alert.  Skin: Skin is warm and dry.  Psychiatric: She has a normal mood and affect. Thought content normal. Her speech is tangential. She is not agitated. Cognition and memory are normal. She expresses impulsivity.    Review of Systems  Constitutional: Negative.   HENT: Negative.   Eyes: Negative.   Respiratory: Negative.   Cardiovascular: Negative.   Gastrointestinal: Negative.   Musculoskeletal: Negative.   Skin: Negative.   Neurological: Negative.   Psychiatric/Behavioral: Negative.     Blood pressure (!) 144/85, pulse 79, temperature 98.1 F  (36.7 C), temperature source Oral, resp. rate 17, height 5\' 7"  (1.702 m), weight 98.4 kg, last menstrual period 04/08/2018, SpO2 99 %.Body mass index is 33.99 kg/m.  General Appearance: Casual  Eye Contact:  Good  Speech:  Clear and Coherent  Volume:  Normal  Mood:  Euthymic  Affect:  Congruent  Thought Process:  Coherent  Orientation:  Full (Time, Place, and Person)  Thought Content:  Logical  Suicidal Thoughts:  No  Homicidal Thoughts:  No  Memory:  Immediate;   Fair Recent;   Fair Remote;   Fair  Judgement:  Fair  Insight:  Fair  Psychomotor Activity:  Normal  Concentration:  Concentration: Fair  Recall:  [employer]  Fund of Knowledge:  Fair  Language:  Fair  Akathisia:  No  Handed:  Right  AIMS (if indicated):     Assets:  Desire for Improvement Physical Health Resilience Social Support  ADL's:  Intact  Cognition:  WNL  Sleep:  Number of Hours: 6.5  Have you used any form of tobacco in the last 30 days? (Cigarettes, Smokeless Tobacco, Cigars, and/or Pipes): Yes  Has this patient used any form of tobacco in the last 30 days? (Cigarettes, Smokeless Tobacco, Cigars, and/or Pipes) Yes, Yes, A prescription for an FDA-approved tobacco cessation medication was offered at discharge and the patient refused  Blood Alcohol level:  Lab Results  Component Value Date   [facility] Ii <10 04/06/2018   ETH <11 06/11/2011    Metabolic Disorder Labs:  Lab Results  Component Value Date   HGBA1C 5.7 (H) 04/08/2018   MPG 116.89 04/08/2018   No results found for: PROLACTIN Lab Results  Component Value Date   CHOL 143 04/08/2018   TRIG 61 04/08/2018   HDL 58 04/08/2018   CHOLHDL 2.5 04/08/2018   VLDL 12 04/08/2018   LDLCALC 73 04/08/2018    See Psychiatric Specialty Exam and Suicide Risk Assessment completed by Attending Physician prior to discharge.  Discharge destination:  Home  Is patient on multiple antipsychotic therapies at discharge:  No   Has Patient had three or more  failed trials of antipsychotic monotherapy by history:  No  Recommended Plan for Multiple Antipsychotic Therapies: NA  Discharge Instructions    Diet - low sodium heart healthy   Complete by:  As directed    Increase activity slowly   Complete by:  As directed      Allergies as of 04/12/2018      Reactions   Onion Itching   Shellfish Allergy Hives   Haloperidol Hives   Lamictal [lamotrigine] Itching, Other (See Comments)   Caused "stevens johnson syndrome"          Follow-up Information    [facility],a facility of [facility]. Go on 04/15/2018.   Why:  Please follow up with Myriam Jacobson at [facility] on Monday 04/15/18 at 5:00pm. She will then schedule you with Dr. Dayton Scrape. Please bring your discharge paperwork from this visit with you to your appointment.  Thank you Contact information: [address] [place], [place] [address] Phone: [phone]          Follow-up recommendations:  Activity:  Activity as tolerated Diet:  Regular diet Other:  Follow-up with outpatient mental health care in [place].  Continue current medicine  Comments: Left voicemail message for the mother telling her about the discharge plan today  Signed: Mordecai Rasmussen, MD 04/12/2018, 3:14 PM

## 2018-04-12 NOTE — Progress Notes (Signed)
  [facility] Adult Case Management Discharge Plan :  Will you be returning to the same living situation after discharge:  No. pt will live with her mother At discharge, do you have transportation home?: Yes,  pt mother will provide transportation Do you have the ability to pay for your medications: Yes,  insurance  Release of information consent forms completed and in the chart;  Patient's signature needed at discharge.  Patient to Follow up at: Follow-up Information    [facility],a facility of [facility]. Go on 04/15/2018.   Why:  Please follow up with Myriam Jacobson at [facility] on Monday 04/15/18 at 5:00pm. She will then schedule you with Dr. Dayton Scrape. Please bring your discharge paperwork from this visit with you to your appointment.  Thank you Contact information: [address] [place], [place] [address] Phone: [phone]          Next level of care provider has access to [facility] Link:no  Safety Planning and Suicide Prevention discussed: Yes,  Beaulah Corin, mother  Have you used any form of tobacco in the last 30 days? (Cigarettes, Smokeless Tobacco, Cigars, and/or Pipes): Yes  Has patient been referred to the Quitline?: Physician has counseled pt on smoking cessation  Patient has been referred for addiction treatment: No  Jeffrie Lofstrom T Moses Odoherty, LCSW 04/12/2018, 3:11 PM

## 2018-04-12 NOTE — Progress Notes (Signed)
Patient ID: Beth Berry, female   DOB: [birth year], 43 y.o.   MRN: [id number] Psychiatry: Spoke with patient today.  Spoke with Child psychotherapist and treatment team.  Patient will be discharged today.  I have called her mother at the telephone number in the chart but there was no answer.  I left a voicemail detailing that the patient was going to be discharged today.  If the patient's mother calls back I will be happy to speak with her but we are proceeding at this time with discharge

## 2018-04-13 NOTE — Progress Notes (Signed)
Patient alert and oriented x 4. Ambulates unit with steady gait. Verbally denies SI/HI/AVH and pain. Patient discharged on above date and time. Verbalized understanding the discharge information provided to patient upon discharge. Patient departed unit with discharge paperwork, prescriptions, 7 day supply of medications and personal belongings. Picked up by friend of the family.

## 2018-04-13 NOTE — Progress Notes (Signed)
[facility]  04/13/2018 9:15 AM Beth Berry  MRN:  [id number]  Subjective:   Per chart review the patient was planned for a discharge yesterday, which was delayed until this morning for logistical reasons. Staff report she has been cooperative and bright.  She was seen briefly this morning, she was able to convey her discharge plan and outlined her follow up care. Denied current suicidality/homicidality. Reports she is tolerating her medications well. No other concerns.   Principal Problem: Schizoaffective disorder, bipolar type (HCC) Diagnosis: Principal Problem:   Schizoaffective disorder, bipolar type (HCC) Active Problems:   PTSD (post-traumatic stress disorder)   Prediabetes  Total Time spent with patient: 15 minutes  Past Psychiatric History: Past history of previous hospitalization a month or so ago.  Has had some unstable behavior recently.  Denies any recent suicidality.  Past Medical History:  Past Medical History:  Diagnosis Date  . Bipolar 1 disorder (HCC)   . Contusion of great toe of right foot 06/11/2011  . Drug abuse (HCC)   . Schizophrenia [facility](HCC)     Past Surgical History:  Procedure Laterality Date  . DENTAL SURGERY     Family History:  Family History  Problem Relation Age of Onset  . Hypertension Mother   . Drug abuse Mother   . Drug abuse Father    Family Psychiatric  History: See above some substance abuse Social History:  Social History   Substance and Sexual Activity  Alcohol Use Yes   Comment: rarely     Social History   Substance and Sexual Activity  Drug Use Yes  . Types: Heroin, MDMA (Ecstacy), Cocaine   Comment: none now, hx of marijuana, narcotic, heroin, cocaine- last use 3 years    Social History   Socioeconomic History  . Marital status: Significant Other    Spouse name: Not on file  . Number of children: Not on file  . Years of education: Not on file  . Highest education level: Not on file  Occupational History   . Not on file  Social Needs  . Financial resource strain: Not on file  . Food insecurity:    Worry: Not on file    Inability: Not on file  . Transportation needs:    Medical: Not on file    Non-medical: Not on file  Tobacco Use  . Smoking status: Current Some Day Smoker    Packs/day: 1.50    Types: Cigarettes  . Smokeless tobacco: Never Used  Substance and Sexual Activity  . Alcohol use: Yes    Comment: rarely  . Drug use: Yes    Types: Heroin, MDMA (Ecstacy), Cocaine    Comment: none now, hx of marijuana, narcotic, heroin, cocaine- last use 3 years  . Sexual activity: Yes    Birth control/protection: None  Lifestyle  . Physical activity:    Days per week: Not on file    Minutes per session: Not on file  . Stress: Not on file  Relationships  . Social connections:    Talks on phone: Not on file    Gets together: Not on file    Attends religious service: Not on file    Active member of club or organization: Not on file    Attends meetings of clubs or organizations: Not on file    Relationship status: Not on file  Other Topics Concern  . Not on file  Social History Narrative  . Not on file   Additional Social History:  Specify valuables returned: all listed on personal item sheet that patient signed and placed in her chart Pain Medications: See MAR Prescriptions: See MAR Over the Counter: See MAR History of alcohol / drug use?: No history of alcohol / drug abuse Negative Consequences of Use: Legal, Personal relationships                    Sleep: Fair  Appetite:  Fair  Current Medications: Current Facility-Administered Medications  Medication Dose Route Frequency Provider Last Rate Last Dose  . acetaminophen (TYLENOL) tablet 650 mg  650 mg Oral Q6H PRN Mariel Craft, MD   650 mg at 04/12/18 2033  . alum & mag hydroxide-simeth (MAALOX/MYLANTA) 200-200-20 MG/5ML suspension 30 mL  30 mL Oral Q4H PRN Mariel Craft, MD      . asenapine (SAPHRIS)  sublingual tablet 10 mg  10 mg Sublingual BID Mariel Craft, MD   10 mg at 04/13/18 0819  . guaiFENesin-dextromethorphan (ROBITUSSIN DM) 100-10 MG/5ML syrup 5 mL  5 mL Oral Q4H PRN Clapacs, Jackquline Denmark, MD   5 mL at 04/12/18 2143  . hydrocerin (EUCERIN) cream   Topical BID Clapacs, John T, MD      . hydrOXYzine (ATARAX/VISTARIL) tablet 25 mg  25 mg Oral BID Mariel Craft, MD   25 mg at 04/13/18 0819  . loratadine (CLARITIN) tablet 10 mg  10 mg Oral Daily Mariel Craft, MD   10 mg at 04/13/18 0388  . magnesium hydroxide (MILK OF MAGNESIA) suspension 30 mL  30 mL Oral Daily PRN Mariel Craft, MD      . meloxicam [facility]) tablet 15 mg  15 mg Oral Daily Mariel Craft, MD   15 mg at 04/13/18 8280  . norgestimate-ethinyl estradiol (ORTHO-CYCLEN,SPRINTEC,PREVIFEM) 0.25-35 MG-MCG tablet 1 tablet  1 tablet Oral Daily Clapacs, John T, MD      . propranolol (INDERAL) tablet 10 mg  10 mg Oral BID Mariel Craft, MD   10 mg at 04/13/18 0820  . traZODone (DESYREL) tablet 50 mg  50 mg Oral QHS Mariel Craft, MD   50 mg at 04/10/18 2156    Lab Results: No results found for this or any previous visit (from the past 48 hour(s)).  Blood Alcohol level:  Lab Results  Component Value Date   [facility] <10 04/06/2018   ETH <11 06/11/2011    Metabolic Disorder Labs: Lab Results  Component Value Date   HGBA1C 5.7 (H) 04/08/2018   MPG 116.89 04/08/2018   No results found for: PROLACTIN Lab Results  Component Value Date   CHOL 143 04/08/2018   TRIG 61 04/08/2018   HDL 58 04/08/2018   CHOLHDL 2.5 04/08/2018   VLDL 12 04/08/2018   LDLCALC 73 04/08/2018    Physical Findings: AIMS: Facial and Oral Movements Muscles of Facial Expression: None, normal Lips and Perioral Area: None, normal Jaw: None, normal Tongue: None, normal,Extremity Movements Upper (arms, wrists, hands, fingers): None, normal Lower (legs, knees, ankles, toes): None, normal, Trunk Movements Neck, shoulders, hips: None, normal,  Overall Severity Severity of abnormal movements (highest score from questions above): None, normal Incapacitation due to abnormal movements: None, normal Patient's awareness of abnormal movements (rate only patient's report): No Awareness, Dental Status Current problems with teeth and/or dentures?: No Does patient usually wear dentures?: No  CIWA:    COWS:     Musculoskeletal: Strength & Muscle Tone: within normal limits Gait & Station: normal Patient leans: N/A  Psychiatric Specialty Exam: Physical Exam  Constitutional: She is oriented to person, place, and time. She appears well-developed and well-nourished.  HENT:  Head: Normocephalic and atraumatic.  Respiratory: Effort normal.  Musculoskeletal: Normal range of motion.  Neurological: She is alert and oriented to person, place, and time.    Review of Systems  Constitutional: Negative.   Cardiovascular: Negative.   Neurological: Negative.     Blood pressure (!) 148/88, pulse 80, temperature 98.5 F (36.9 C), temperature source Oral, resp. rate 18, height 5\' 7"  (1.702 m), weight 98.4 kg, last menstrual period 04/08/2018, SpO2 100 %.Body mass index is 33.99 kg/m.  General Appearance: Casual  Eye Contact:  Fair  Speech:  Normal Rate  Volume:  Normal  Mood:  "good"  Affect:  Appropriate and Full Range  Thought Process:  Linear  Orientation:  Full (Time, Place, and Person)  Thought Content:  Logical  Suicidal Thoughts:  No  Homicidal Thoughts:  No  Memory:  Immediate;   Fair Recent;   Fair  Judgement:  Intact  Insight:  Present  Psychomotor Activity:  Normal  Concentration:  Concentration: Fair  Recall:  [employer] of Knowledge:  Fair  Language:  Good  Akathisia:  No  Handed:  Right  AIMS (if indicated):     Assets:  Chief of Staff  ADL's:  Intact  Cognition:  WNL  Sleep:  Number of Hours: 6.5     Treatment Plan Summary: The patient appears to have maintained her  stability compared to yesterday. This is my first time meeting this patient but she appears to be safe at this time and will discharged according to her previously set plan. Please note her discharge summary is dated 04/12/2018 but should read 04/13/2018  Luciano Cutter, DO 04/13/2018, 9:15 AM

## 2018-04-13 NOTE — Progress Notes (Signed)
Patient alert and oriented x 4, denies SI/HI/AVH no distress noted, affect is blunted, mood is pleasant and she is receptive towards staff. Patient is scheduled for discharge tomorrow morning , patient is elated and excited about going  home. Patient is complaint with medication and interacting appropriately with peers and staff.15 minutes safety checks maintained, will continue to monitor.

## 2018-04-16 ENCOUNTER — Telehealth: Payer: Self-pay

## 2018-04-16 NOTE — Telephone Encounter (Signed)
pharmacy [facility] on [address] called left message that the patient insurance will not cover but one pill a day. will need to send a new rx for a saphris

## 2018-06-07 ENCOUNTER — Other Ambulatory Visit: Payer: Self-pay | Admitting: Psychiatry

## 2018-09-08 DIAGNOSIS — F1410 Cocaine abuse, uncomplicated: Secondary | ICD-10-CM | POA: Insufficient documentation

## 2018-09-08 DIAGNOSIS — Z9114 Patient's other noncompliance with medication regimen: Secondary | ICD-10-CM

## 2018-11-03 ENCOUNTER — Other Ambulatory Visit: Payer: Self-pay | Admitting: Psychiatry

## 2019-12-10 DIAGNOSIS — R768 Other specified abnormal immunological findings in serum: Secondary | ICD-10-CM | POA: Insufficient documentation

## 2019-12-10 DIAGNOSIS — R7982 Elevated C-reactive protein (CRP): Secondary | ICD-10-CM | POA: Insufficient documentation

## 2020-05-27 ENCOUNTER — Encounter: Payer: Self-pay | Admitting: Psychiatry

## 2020-05-27 ENCOUNTER — Emergency Department
Admission: EM | Admit: 2020-05-27 | Discharge: 2020-05-27 | Disposition: A | Discharge status: Other Acute Inpatient Hospital | Payer: No Typology Code available for payment source | Attending: Emergency Medicine | Admitting: Emergency Medicine

## 2020-05-27 ENCOUNTER — Inpatient Hospital Stay
Admission: RE | Admit: 2020-05-27 | Discharge: 2020-06-03 | DRG: 885 | Disposition: A | Discharge status: Home / Self Care | Payer: Medicare Other | Source: Intra-hospital | Attending: Behavioral Health | Admitting: Behavioral Health

## 2020-05-27 ENCOUNTER — Other Ambulatory Visit: Payer: Self-pay

## 2020-05-27 DIAGNOSIS — F4312 Post-traumatic stress disorder, chronic: Secondary | ICD-10-CM | POA: Diagnosis not present

## 2020-05-27 DIAGNOSIS — F3174 Bipolar disorder, in full remission, most recent episode manic: Secondary | ICD-10-CM | POA: Insufficient documentation

## 2020-05-27 DIAGNOSIS — F3010 Manic episode without psychotic symptoms, unspecified: Secondary | ICD-10-CM

## 2020-05-27 DIAGNOSIS — F4310 Post-traumatic stress disorder, unspecified: Secondary | ICD-10-CM | POA: Diagnosis present

## 2020-05-27 DIAGNOSIS — Z9114 Patient's other noncompliance with medication regimen: Secondary | ICD-10-CM

## 2020-05-27 DIAGNOSIS — R45851 Suicidal ideations: Secondary | ICD-10-CM | POA: Diagnosis present

## 2020-05-27 DIAGNOSIS — F1219 Cannabis abuse with unspecified cannabis-induced disorder: Secondary | ICD-10-CM | POA: Diagnosis not present

## 2020-05-27 DIAGNOSIS — F3113 Bipolar disorder, current episode manic without psychotic features, severe: Principal | ICD-10-CM | POA: Diagnosis present

## 2020-05-27 DIAGNOSIS — F302 Manic episode, severe with psychotic symptoms: Secondary | ICD-10-CM | POA: Diagnosis not present

## 2020-05-27 DIAGNOSIS — F259 Schizoaffective disorder, unspecified: Secondary | ICD-10-CM | POA: Diagnosis present

## 2020-05-27 DIAGNOSIS — F17210 Nicotine dependence, cigarettes, uncomplicated: Secondary | ICD-10-CM | POA: Diagnosis present

## 2020-05-27 DIAGNOSIS — Z813 Family history of other psychoactive substance abuse and dependence: Secondary | ICD-10-CM | POA: Diagnosis not present

## 2020-05-27 DIAGNOSIS — F1210 Cannabis abuse, uncomplicated: Secondary | ICD-10-CM | POA: Diagnosis present

## 2020-05-27 DIAGNOSIS — F250 Schizoaffective disorder, bipolar type: Secondary | ICD-10-CM | POA: Diagnosis present

## 2020-05-27 DIAGNOSIS — Z046 Encounter for general psychiatric examination, requested by authority: Secondary | ICD-10-CM | POA: Insufficient documentation

## 2020-05-27 DIAGNOSIS — Z20822 Contact with and (suspected) exposure to covid-19: Secondary | ICD-10-CM | POA: Insufficient documentation

## 2020-05-27 DIAGNOSIS — Z9151 Personal history of suicidal behavior: Secondary | ICD-10-CM | POA: Diagnosis not present

## 2020-05-27 DIAGNOSIS — F312 Bipolar disorder, current episode manic severe with psychotic features: Secondary | ICD-10-CM | POA: Diagnosis present

## 2020-05-27 DIAGNOSIS — Z79899 Other long term (current) drug therapy: Secondary | ICD-10-CM

## 2020-05-27 LAB — URINE DRUG SCREEN, QUALITATIVE (ARMC ONLY)
Amphetamines, Ur Screen: NOT DETECTED
Barbiturates, Ur Screen: NOT DETECTED
Benzodiazepine, Ur Scrn: NOT DETECTED
Cannabinoid 50 Ng, Ur ~~LOC~~: POSITIVE — AB
Cocaine Metabolite,Ur ~~LOC~~: NOT DETECTED
MDMA (Ecstasy)Ur Screen: NOT DETECTED
Methadone Scn, Ur: NOT DETECTED
Opiate, Ur Screen: NOT DETECTED
Phencyclidine (PCP) Ur S: NOT DETECTED
Tricyclic, Ur Screen: POSITIVE — AB

## 2020-05-27 LAB — URINALYSIS, COMPLETE (UACMP) WITH MICROSCOPIC
Bacteria, UA: NONE SEEN
Bilirubin Urine: NEGATIVE
Glucose, UA: NEGATIVE mg/dL
Ketones, ur: NEGATIVE mg/dL
Leukocytes,Ua: NEGATIVE
Nitrite: NEGATIVE
Protein, ur: NEGATIVE mg/dL
Specific Gravity, Urine: 1.006 (ref 1.005–1.030)
pH: 6 (ref 5.0–8.0)

## 2020-05-27 LAB — COMPREHENSIVE METABOLIC PANEL
ALT: 17 U/L (ref 0–44)
AST: 19 U/L (ref 15–41)
Albumin: 3.6 g/dL (ref 3.5–5.0)
Alkaline Phosphatase: 72 U/L (ref 38–126)
Anion gap: 8 (ref 5–15)
BUN: 10 mg/dL (ref 6–20)
CO2: 22 mmol/L (ref 22–32)
Calcium: 8.6 mg/dL — ABNORMAL LOW (ref 8.9–10.3)
Chloride: 106 mmol/L (ref 98–111)
Creatinine, Ser: 0.76 mg/dL (ref 0.44–1.00)
GFR, Estimated: >60 mL/min (ref >60)
Glucose, Bld: 105 mg/dL — ABNORMAL HIGH (ref 70–99)
Potassium: 3.5 mmol/L (ref 3.5–5.1)
Sodium: 136 mmol/L (ref 135–145)
Total Bilirubin: 0.5 mg/dL (ref 0.3–1.2)
Total Protein: 7 g/dL (ref 6.5–8.1)

## 2020-05-27 LAB — CBC
HCT: 33.3 % — ABNORMAL LOW (ref 36.0–46.0)
Hemoglobin: 10.8 g/dL — ABNORMAL LOW (ref 12.0–15.0)
MCH: 30.5 pg (ref 26.0–34.0)
MCHC: 32.4 g/dL (ref 30.0–36.0)
MCV: 94.1 fL (ref 80.0–100.0)
Platelets: 300 10*3/uL (ref 150–400)
RBC: 3.54 MIL/uL — ABNORMAL LOW (ref 3.87–5.11)
RDW: 12.8 % (ref 11.5–15.5)
WBC: 11.9 10*3/uL — ABNORMAL HIGH (ref 4.0–10.5)
nRBC: 0 % (ref 0.0–0.2)

## 2020-05-27 LAB — ETHANOL: Alcohol, Ethyl (B): <10 mg/dL (ref <10)

## 2020-05-27 LAB — RESP PANEL BY RT-PCR (FLU A&B, COVID) ARPGX2
Influenza A by PCR: NEGATIVE
Influenza B by PCR: NEGATIVE
SARS Coronavirus 2 by RT PCR: NEGATIVE

## 2020-05-27 MED ORDER — LORAZEPAM 2 MG PO TABS
2.0000 mg | ORAL_TABLET | Freq: Once | ORAL | Status: AC
  Administered 2020-05-27: 2 mg via ORAL
  Filled 2020-05-27: qty 1

## 2020-05-27 MED ORDER — PROPRANOLOL HCL 20 MG PO TABS
10.0000 mg | ORAL_TABLET | Freq: Two times a day (BID) | ORAL | Status: DC
  Administered 2020-05-28 – 2020-06-03 (×13): 10 mg via ORAL
  Filled 2020-05-27 (×15): qty 1

## 2020-05-27 MED ORDER — QUETIAPINE FUMARATE 200 MG PO TABS: 200.0000 mg | ORAL_TABLET | Freq: Every day | ORAL | Status: DC

## 2020-05-27 MED ORDER — ACETAMINOPHEN 325 MG PO TABS
650.0000 mg | ORAL_TABLET | Freq: Four times a day (QID) | ORAL | Status: DC | PRN
  Administered 2020-05-29 – 2020-05-30 (×2): 650 mg via ORAL
  Filled 2020-05-27 (×2): qty 2

## 2020-05-27 MED ORDER — ASENAPINE MALEATE 5 MG SL SUBL
10.0000 mg | SUBLINGUAL_TABLET | Freq: Two times a day (BID) | SUBLINGUAL | Status: DC
  Filled 2020-05-27 (×2): qty 2

## 2020-05-27 MED ORDER — HYDROXYZINE HCL 50 MG PO TABS
50.0000 mg | ORAL_TABLET | Freq: Three times a day (TID) | ORAL | Status: DC | PRN
  Administered 2020-05-27 – 2020-06-03 (×10): 50 mg via ORAL
  Filled 2020-05-27 (×10): qty 1

## 2020-05-27 MED ORDER — ZIPRASIDONE HCL 20 MG PO CAPS
20.0000 mg | ORAL_CAPSULE | Freq: Three times a day (TID) | ORAL | Status: DC | PRN
  Administered 2020-05-28 – 2020-05-30 (×2): 20 mg via ORAL
  Filled 2020-05-27 (×2): qty 1

## 2020-05-27 MED ORDER — MAGNESIUM HYDROXIDE 400 MG/5ML PO SUSP: 30.0000 mL | Freq: Every day | ORAL | Status: DC | PRN

## 2020-05-27 MED ORDER — QUETIAPINE FUMARATE ER 300 MG PO TB24
300.0000 mg | ORAL_TABLET | Freq: Every day | ORAL | Status: DC
  Administered 2020-05-27: 300 mg via ORAL
  Filled 2020-05-27 (×2): qty 1

## 2020-05-27 MED ORDER — TOPIRAMATE 25 MG PO TABS
25.0000 mg | ORAL_TABLET | Freq: Two times a day (BID) | ORAL | Status: DC
  Administered 2020-05-27 – 2020-06-03 (×14): 25 mg via ORAL
  Filled 2020-05-27 (×14): qty 1

## 2020-05-27 MED ORDER — ALUM & MAG HYDROXIDE-SIMETH 200-200-20 MG/5ML PO SUSP: 30.0000 mL | ORAL | Status: DC | PRN

## 2020-05-27 MED ORDER — ZIPRASIDONE MESYLATE 20 MG IM SOLR: 20.0000 mg | Freq: Three times a day (TID) | INTRAMUSCULAR | Status: DC | PRN

## 2020-05-27 MED ORDER — TEMAZEPAM 15 MG PO CAPS
15.0000 mg | ORAL_CAPSULE | Freq: Every evening | ORAL | Status: DC | PRN
  Administered 2020-05-28 – 2020-06-01 (×4): 15 mg via ORAL
  Filled 2020-05-27 (×5): qty 1

## 2020-05-27 MED ORDER — DIPHENHYDRAMINE HCL 25 MG PO CAPS
50.0000 mg | ORAL_CAPSULE | Freq: Once | ORAL | Status: AC
  Administered 2020-05-27: 50 mg via ORAL
  Filled 2020-05-27: qty 2

## 2020-05-27 NOTE — ED Notes (Signed)
Pt dressed out into burgundy scrubs by Kara Mead, EDT.  Pt belongings include:  Blue and white socks Grey tank top Purple sports bra Rainbow bracelet Silver bracelet Black and white hair tie

## 2020-05-27 NOTE — Consult Note (Signed)
  Addendum: Found recent outpatient records in the care everywhere section.  Patient apparently still seeing someone for mental health treatment as recently as 2 weeks ago.  Most recent notes unfortunately contain no specific clinical information.  Last documentation of specific treatment that I can find is from December 2021.  She was hospitalized in the earlier part of that month and was reported to be on 200 mg of Seroquel after discharge.  Changed orders accordingly.

## 2020-05-27 NOTE — ED Provider Notes (Signed)
[facility] Emergency Department Provider Note  ____________________________________________  Time seen: Approximately 5:15 AM  I have reviewed the triage vital signs and the nursing notes.   HISTORY  Chief Complaint Psychiatric Evaluation   HPI Beth Berry is a 45 y.o. female with a history of bipolar disorder, schizophrenia, prediabetes who presents under IVC by her cousin for psychiatric evaluation.  According to the IVC papers, patient has not been taking her medications, has tried to jump out of a moving vehicle, has taken her mother's food out of the freezer and let it spoiled, has rearranged all the household furniture.  Patient is very pressured speech with flight of ideas.  She is denying any suicidal thoughts at this time.  She tells me that her cousin IVC to her because she wants the patient in the hospital so she can still from patient's mom.   Past Medical History:  Diagnosis Date  . Bipolar 1 disorder (HCC)   . Contusion of great toe of right foot 06/11/2011  . Drug abuse (HCC)   . Schizophrenia [facility])     Patient Active Problem List   Diagnosis Date Noted  . Schizoaffective disorder, bipolar type (HCC) 04/09/2018  . PTSD (post-traumatic stress disorder) 04/09/2018  . Prediabetes 04/09/2018  . Bipolar affective disorder, manic, severe (HCC) 04/07/2018    Past Surgical History:  Procedure Laterality Date  . DENTAL SURGERY      Prior to Admission medications   Medication Sig Start Date End Date Taking? Authorizing Provider  asenapine (SAPHRIS) 5 MG SUBL 24 hr tablet Place 2 tablets (10 mg total) under the tongue 2 (two) times daily. 04/12/18   Clapacs, Jackquline Denmark, MD  hydrOXYzine (ATARAX/VISTARIL) 25 MG tablet Take 1 tablet (25 mg total) by mouth 2 (two) times daily. 04/12/18   Clapacs, Jackquline Denmark, MD  loratadine (CLARITIN) 10 MG tablet Take 1 tablet (10 mg total) by mouth daily. 04/12/18   Clapacs, Jackquline Denmark, MD  meloxicam (MOBIC) 15 MG  tablet Take 1 tablet (15 mg total) by mouth daily. 04/12/18   Clapacs, Jackquline Denmark, MD  norgestimate-ethinyl estradiol (ORTHO-CYCLEN,SPRINTEC,PREVIFEM) 0.25-35 MG-MCG tablet Take 1 tablet by mouth daily. 04/12/18   Clapacs, Jackquline Denmark, MD  propranolol (INDERAL) 10 MG tablet Take 1 tablet (10 mg total) by mouth 2 (two) times daily. 04/12/18   Clapacs, Jackquline Denmark, MD  traZODone (DESYREL) 50 MG tablet Take 1 tablet (50 mg total) by mouth at bedtime. 04/12/18   Clapacs, Jackquline Denmark, MD    Allergies Onion, Shellfish allergy, Haloperidol, and Lamictal [lamotrigine]  Family History  Problem Relation Age of Onset  . Hypertension Mother   . Drug abuse Mother   . Drug abuse Father     Social History Social History   Tobacco Use  . Smoking status: Current Some Day Smoker    Packs/day: 1.50    Types: Cigarettes  . Smokeless tobacco: Never Used  Substance Use Topics  . Alcohol use: Yes    Comment: rarely  . Drug use: Yes    Types: Heroin, MDMA (Ecstacy), Cocaine    Comment: none now, hx of marijuana, narcotic, heroin, cocaine- last use 3 years    Review of Systems  Constitutional: Negative for fever. Eyes: Negative for visual changes. ENT: Negative for sore throat. Neck: No neck pain  Cardiovascular: Negative for chest pain. Respiratory: Negative for shortness of breath. Gastrointestinal: Negative for abdominal pain, vomiting or diarrhea. Genitourinary: Negative for dysuria. Musculoskeletal: Negative for back pain. Skin: Negative  for rash. Neurological: Negative for headaches, weakness or numbness. Psych: No SI or HI. + manic  ____________________________________________   PHYSICAL EXAM:  VITAL SIGNS: ED Triage Vitals  Enc Vitals Group     BP 05/27/20 0210 132/79     Pulse Rate 05/27/20 0208 93     Resp 05/27/20 0208 18     Temp 05/27/20 0208 98.4 F (36.9 C)     Temp Source 05/27/20 0208 Oral     SpO2 05/27/20 0208 98 %     Weight 05/27/20 0208 200 lb (90.7 kg)     Height 05/27/20 0208  5\' 6"  (1.676 m)     Head Circumference --      Peak Flow --      Pain Score 05/27/20 0208 2     Pain Loc --      Pain Edu? --      Excl. in GC? --     Constitutional: Alert and oriented. Well appearing and in no apparent distress. HEENT:      Head: Normocephalic and atraumatic.         Eyes: Conjunctivae are normal. Sclera is non-icteric.       Mouth/Throat: Mucous membranes are moist.       Neck: Supple with no signs of meningismus. Cardiovascular: Regular rate and rhythm.  Respiratory: Normal respiratory effort.  Gastrointestinal: Soft, non tender, and non distended. Musculoskeletal: No edema, cyanosis, or erythema of extremities. Neurologic: Normal speech and language. Face is symmetric. Moving all extremities. No gross focal neurologic deficits are appreciated. Skin: Skin is warm, dry and intact. No rash noted. Psychiatric: Pressured speech., flight of ideas, agitated  ____________________________________________   LABS (all labs ordered are listed, but only abnormal results are displayed)  Labs Reviewed  CBC - Abnormal; Notable for the following components:      Result Value   WBC 11.9 (*)    RBC 3.54 (*)    Hemoglobin 10.8 (*)    HCT 33.3 (*)    All other components within normal limits  COMPREHENSIVE METABOLIC PANEL - Abnormal; Notable for the following components:   Glucose, Bld 105 (*)    Calcium 8.6 (*)    All other components within normal limits  URINALYSIS, COMPLETE (UACMP) WITH MICROSCOPIC - Abnormal; Notable for the following components:   Color, Urine YELLOW (*)    APPearance CLEAR (*)    Hgb urine dipstick SMALL (*)    All other components within normal limits  URINE DRUG SCREEN, QUALITATIVE ([facility] ONLY) - Abnormal; Notable for the following components:   Tricyclic, Ur Screen POSITIVE (*)    Cannabinoid 50 Ng, Ur [place] POSITIVE (*)    All other components within normal limits  ETHANOL  POC URINE PREG, ED    ____________________________________________  EKG  none  ____________________________________________  RADIOLOGY  none  ____________________________________________   PROCEDURES  Procedure(s) performed: None Procedures Critical Care performed:  None ____________________________________________   INITIAL IMPRESSION / ASSESSMENT AND PLAN / ED COURSE  45 y.o. female with a history of bipolar disorder, schizophrenia, prediabetes who presents under IVC by her cousin for psychiatric evaluation.  Patient is manic, agitated, pressured speech, flight of ideas.  No current SI or HI.  UDS positive for TCA and cannabinoids.  Labs otherwise with no acute findings.  Patient is medically clear awaiting for psychiatric evaluation.  Will maintain patient IVC.   The patient has been placed in psychiatric observation due to the need to provide a safe environment for the patient  while obtaining psychiatric consultation and evaluation, as well as ongoing medical and medication management to treat the patient's condition.  The patient has been placed under full IVC at this time.       Please note:  Patient was evaluated in Emergency Department today for the symptoms described in the history of present illness. Patient was evaluated in the context of the global COVID-19 pandemic, which necessitated consideration that the patient might be at risk for infection with the SARS-CoV-2 virus that causes COVID-19. Institutional protocols and algorithms that pertain to the evaluation of patients at risk for COVID-19 are in a state of rapid change based on information released by regulatory bodies including the CDC and federal and state organizations. These policies and algorithms were followed during the patient's care in the ED.  Some ED evaluations and interventions may be delayed as a result of limited staffing during the pandemic.   ____________________________________________   FINAL CLINICAL  IMPRESSION(S) / ED DIAGNOSES   Final diagnoses:  Manic behavior (HCC)      NEW MEDICATIONS STARTED DURING THIS VISIT:  ED Discharge Orders    None       Note:  This document was prepared using Dragon voice recognition software and may include unintentional dictation errors.    Nita Sickle, MD 05/27/20 [phone]

## 2020-05-27 NOTE — Consult Note (Signed)
[facility] Face-to-Face Psychiatry Consult   Reason for Consult: Psychiatric evaluation Referring Physician: Dr. Don Perking Patient Identification: Beth Berry MRN:  [id number] Principal Diagnosis: <principal problem not specified> Diagnosis:  Active Problems:   Bipolar affective disorder, manic, severe (HCC)   Schizoaffective disorder, bipolar type (HCC)   PTSD (post-traumatic stress disorder)   Total Time spent with patient: 30 minutes  Subjective: " My cousin who I do not like got me in here." Beth Berry is a 45 y.o. female patient presented to [facility] ED via law enforcement under involuntary commitment status (IVC).  Per the ED triage nurse note, Pt brought in under IVC for psychiatric evaluation, pt is bipolar and not taking her meds.  The patient carries diagnoses of bipolar disorder type I, PTSD, and prior alcohol use disorder. Per records, she is admitted due to increased energy, impulsive decision-making, irritability, and aggressive behavior. She has limited insight into behaviors. Today in the interview, she presents with increased mania and some psychotic symptoms. The patient was seen face-to-face by this provider; the chart reviewed and consulted with Dr.Veronese on 05/27/2020 due to the patient's care. It was discussed with the EDP that the patient does meet the criteria to be admitted to the inpatient unit.  On evaluation, the patient reports alert and oriented x 4, manic, pressured speech but cooperative, and mood-congruent with affect. The patient does not appear to be responding to internal or external stimuli. The patient is presenting with some delusional thinking. The patient denies auditory or visual hallucinations. The patient denies any suicidal, homicidal, or self-harm ideations. The patient is presenting with some psychotic behaviors. The patient began to communicate via sign language while voicing she is hard of hearing. The patient also believes she is a Psychiatrist. She has lots of drawings on her lower extremities. Per drawings, I may and in which it does not have any significant artistry to it. During an encounter with the patient, she was able to answer questions appropriately.  HPI:    Past Psychiatric History:   Risk to Self:  Yes Risk to Others:  No Prior Inpatient Therapy:  Yes Prior Outpatient Therapy:  Yes  Past Medical History:  Past Medical History:  Diagnosis Date  . Bipolar 1 disorder (HCC)   . Contusion of great toe of right foot 06/11/2011  . Drug abuse (HCC)   . Schizophrenia [facility])     Past Surgical History:  Procedure Laterality Date  . DENTAL SURGERY     Family History:  Family History  Problem Relation Age of Onset  . Hypertension Mother   . Drug abuse Mother   . Drug abuse Father    Family Psychiatric  History:  Social History:  Social History   Substance and Sexual Activity  Alcohol Use Yes   Comment: rarely     Social History   Substance and Sexual Activity  Drug Use Yes  . Types: Heroin, MDMA (Ecstacy), Cocaine   Comment: none now, hx of marijuana, narcotic, heroin, cocaine- last use 3 years    Social History   Socioeconomic History  . Marital status: Significant Other    Spouse name: Not on file  . Number of children: Not on file  . Years of education: Not on file  . Highest education level: Not on file  Occupational History  . Not on file  Tobacco Use  . Smoking status: Current Some Day Smoker    Packs/day: 1.50    Types: Cigarettes  . Smokeless tobacco:  Never Used  Substance and Sexual Activity  . Alcohol use: Yes    Comment: rarely  . Drug use: Yes    Types: Heroin, MDMA (Ecstacy), Cocaine    Comment: none now, hx of marijuana, narcotic, heroin, cocaine- last use 3 years  . Sexual activity: Yes    Birth control/protection: None  Other Topics Concern  . Not on file  Social History Narrative  . Not on file   Social Determinants of Health   Financial Resource Strain: Not on  file  Food Insecurity: Not on file  Transportation Needs: Not on file  Physical Activity: Not on file  Stress: Not on file  Social Connections: Not on file   Additional Social History:    Allergies:   Allergies  Allergen Reactions  . Onion Itching  . Shellfish Allergy Hives  . Haloperidol Hives  . Lamictal [Lamotrigine] Itching and Other (See Comments)    Caused "stevens johnson syndrome"    Labs:  Results for orders placed or performed during the hospital encounter of 05/27/20 (from the past 48 hour(s))  CBC     Status: Abnormal   Collection Time: 05/27/20  2:11 AM  Result Value Ref Range   WBC 11.9 (H) 4.0 - 10.5 K/uL   RBC 3.54 (L) 3.87 - 5.11 MIL/uL   Hemoglobin 10.8 (L) 12.0 - 15.0 g/dL   HCT 83.3 (L) 82.5 - 05.3 %   MCV 94.1 80.0 - 100.0 fL   MCH 30.5 26.0 - 34.0 pg   MCHC 32.4 30.0 - 36.0 g/dL   RDW 97.6 73.4 - 19.3 %   Platelets 300 150 - 400 K/uL   nRBC 0.0 0.0 - 0.2 %    Comment: Performed at [facility], [address] [place], [place] [address]  Comprehensive metabolic panel     Status: Abnormal   Collection Time: 05/27/20  2:11 AM  Result Value Ref Range   Sodium 136 135 - 145 mmol/L   Potassium 3.5 3.5 - 5.1 mmol/L   Chloride 106 98 - 111 mmol/L   CO2 22 22 - 32 mmol/L   Glucose, Bld 105 (H) 70 - 99 mg/dL    Comment: Glucose reference range applies only to samples taken after fasting for at least 8 hours.   BUN 10 6 - 20 mg/dL   Creatinine, Ser 0.97 0.44 - 1.00 mg/dL   Calcium 8.6 (L) 8.9 - 10.3 mg/dL   Total Protein 7.0 6.5 - 8.1 g/dL   Albumin 3.6 3.5 - 5.0 g/dL   AST 19 15 - 41 U/L   ALT 17 0 - 44 U/L   Alkaline Phosphatase 72 38 - 126 U/L   Total Bilirubin 0.5 0.3 - 1.2 mg/dL   GFR, Estimated >35 >32 mL/min    Comment: (NOTE) Calculated using the CKD-EPI Creatinine Equation (2021)    Anion gap 8 5 - 15    Comment: Performed at [facility], [address] [place], [place] [address]  Ethanol     Status: None    Collection Time: 05/27/20  2:11 AM  Result Value Ref Range   Alcohol, Ethyl (B) <10 <10 mg/dL    Comment: (NOTE) Lowest detectable limit for serum alcohol is 10 mg/dL.  For medical purposes only. Performed at [facility], [address] [place], [place] [address]   Urinalysis, Complete w Microscopic     Status: Abnormal   Collection Time: 05/27/20  2:57 AM  Result Value Ref Range   Color,  Urine YELLOW (A) YELLOW   APPearance CLEAR (A) CLEAR   Specific Gravity, Urine 1.006 1.005 - 1.030   pH 6.0 5.0 - 8.0   Glucose, UA NEGATIVE NEGATIVE mg/dL   Hgb urine dipstick SMALL (A) NEGATIVE   Bilirubin Urine NEGATIVE NEGATIVE   Ketones, ur NEGATIVE NEGATIVE mg/dL   Protein, ur NEGATIVE NEGATIVE mg/dL   Nitrite NEGATIVE NEGATIVE   Leukocytes,Ua NEGATIVE NEGATIVE   RBC / HPF 0-5 0 - 5 RBC/hpf   WBC, UA 0-5 0 - 5 WBC/hpf   Bacteria, UA NONE SEEN NONE SEEN   Squamous Epithelial / LPF 0-5 0 - 5   Mucus PRESENT     Comment: Performed at [facility], [address] [place], [place] [address]  Urine Drug Screen, Qualitative ([facility] only)     Status: Abnormal   Collection Time: 05/27/20  2:57 AM  Result Value Ref Range   Tricyclic, Ur Screen POSITIVE (A) NONE DETECTED   Amphetamines, Ur Screen NONE DETECTED NONE DETECTED   MDMA (Ecstasy)Ur Screen NONE DETECTED NONE DETECTED   Cocaine Metabolite,Ur [place] NONE DETECTED NONE DETECTED   Opiate, Ur Screen NONE DETECTED NONE DETECTED   Phencyclidine (PCP) Ur S NONE DETECTED NONE DETECTED   Cannabinoid 50 Ng, Ur [place] POSITIVE (A) NONE DETECTED   Barbiturates, Ur Screen NONE DETECTED NONE DETECTED   Benzodiazepine, Ur Scrn NONE DETECTED NONE DETECTED   Methadone Scn, Ur NONE DETECTED NONE DETECTED    Comment: (NOTE) Tricyclics + metabolites, urine    Cutoff 1000 ng/mL Amphetamines + metabolites, urine  Cutoff 1000 ng/mL MDMA (Ecstasy), urine              Cutoff 500 ng/mL Cocaine Metabolite, urine          Cutoff 300 ng/mL Opiate  + metabolites, urine        Cutoff 300 ng/mL Phencyclidine (PCP), urine         Cutoff 25 ng/mL Cannabinoid, urine                 Cutoff 50 ng/mL Barbiturates + metabolites, urine  Cutoff 200 ng/mL Benzodiazepine, urine              Cutoff 200 ng/mL Methadone, urine                   Cutoff 300 ng/mL  The urine drug screen provides only a preliminary, unconfirmed analytical test result and should not be used for non-medical purposes. Clinical consideration and professional judgment should be applied to any positive drug screen result due to possible interfering substances. A more specific alternate chemical method must be used in order to obtain a confirmed analytical result. Gas chromatography / mass spectrometry (GC/MS) is the preferred confirm atory method. Performed at [facility], [address] [place], [place] [address]     No current facility-administered medications for this encounter.   Current Outpatient Medications  Medication Sig Dispense Refill  . asenapine (SAPHRIS) 5 MG SUBL 24 hr tablet Place 2 tablets (10 mg total) under the tongue 2 (two) times daily. 60 tablet 1  . hydrOXYzine (ATARAX/VISTARIL) 25 MG tablet Take 1 tablet (25 mg total) by mouth 2 (two) times daily. 60 tablet 1  . loratadine (CLARITIN) 10 MG tablet Take 1 tablet (10 mg total) by mouth daily. 30 tablet 1  . meloxicam (MOBIC) 15 MG tablet Take 1 tablet (15 mg total) by mouth daily. 30 tablet 1  . norgestimate-ethinyl estradiol (ORTHO-CYCLEN,SPRINTEC,PREVIFEM) 0.25-35 MG-MCG tablet  Take 1 tablet by mouth daily. 1 Package 1  . propranolol (INDERAL) 10 MG tablet Take 1 tablet (10 mg total) by mouth 2 (two) times daily. 60 tablet 1  . traZODone (DESYREL) 50 MG tablet Take 1 tablet (50 mg total) by mouth at bedtime. 30 tablet 1    Musculoskeletal: Strength & Muscle Tone: within normal limits Gait & Station: normal Patient leans: N/A  Psychiatric Specialty Exam:  Presentation  General  Appearance: Disheveled  Eye Contact:Good  Speech:Pressured  Speech Volume:Increased  Handedness:Right   Mood and Affect  Mood:Anxious; Euphoric; Irritable  Affect:Inappropriate; Full Range; Congruent   Thought Process  Thought Processes:Disorganized  Descriptions of Associations:Tangential  Orientation:Full (Time, Place and Person)  Thought Content:Delusions; Scattered; Tangential  History of Schizophrenia/Schizoaffective disorder:Yes  Duration of Psychotic Symptoms:Greater than six months  Hallucinations:Hallucinations: None  Ideas of Reference:None  Suicidal Thoughts:Suicidal Thoughts: No  Homicidal Thoughts:Homicidal Thoughts: No   Sensorium  Memory:Immediate Fair; Recent Fair; Remote Fair  Judgment:Poor  Insight:Lacking   Executive Functions  Concentration:Fair  Attention Span:Fair  Recall:Fair  Fund of Knowledge:Fair  Language:Fair   Psychomotor Activity  Psychomotor Activity:Psychomotor Activity: Normal   Assets  Assets:Desire for Improvement; Social Support; Resilience   Sleep  Sleep:Sleep: Fair   Physical Exam: Physical Exam Vitals and nursing note reviewed.  Constitutional:      Appearance: She is obese.  HENT:     Right Ear: External ear normal.     Left Ear: External ear normal.     Nose: Nose normal.     Mouth/Throat:     Mouth: Mucous membranes are moist.  Cardiovascular:     Rate and Rhythm: Normal rate.     Pulses: Normal pulses.  Pulmonary:     Effort: Pulmonary effort is normal.  Musculoskeletal:        General: Normal range of motion.     Cervical back: Normal range of motion and neck supple.  Neurological:     Mental Status: She is alert and oriented to person, place, and time.  Psychiatric:        Attention and Perception: Attention and perception normal.        Mood and Affect: Mood is elated. Affect is inappropriate.        Speech: Speech is rapid and pressured and tangential.        Thought Content:  Thought content is delusional.        Cognition and Memory: Cognition normal.        Judgment: Judgment is impulsive and inappropriate.    Review of Systems  Psychiatric/Behavioral: The patient is nervous/anxious and has insomnia.   All other systems reviewed and are negative.  Blood pressure 132/79, pulse 93, temperature 98.4 F (36.9 C), temperature source Oral, resp. rate 18, height 5\' 6"  (1.676 m), weight 90.7 kg, SpO2 98 %. Body mass index is 32.28 kg/m.  Treatment Plan Summary: Daily contact with patient to assess and evaluate symptoms and progress in treatment  Disposition: Recommend psychiatric Inpatient admission when medically cleared. Supportive therapy provided about ongoing stressors.  , NP 05/27/2020 4:09 AM

## 2020-05-27 NOTE — ED Notes (Signed)
Patient to be admitted to [facility] awaiting bed status

## 2020-05-27 NOTE — ED Notes (Signed)
Pt refusing to change at this time.
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
## 2020-05-27 NOTE — ED Notes (Signed)
IVC/pending placement after medical clearance

## 2020-05-27 NOTE — BH Assessment (Signed)
Referral information for Psychiatric Hospitalization faxed to;   . [facility] ([phone]-or- [phone]),   . [facility] ([phone]),   . [facility] ([phone] -or- [phone]),   . [facility] ([phone])  

## 2020-05-27 NOTE — ED Notes (Signed)
Pt given warm blanket at this time 

## 2020-05-27 NOTE — ED Notes (Signed)
Pt resting comfortably at this time. Normal rise and fall of chest. NAD noted. Pt waiting for psych bed after medical clearence.

## 2020-05-27 NOTE — ED Triage Notes (Signed)
Pt brought in under IVC for psychiatric evaluation, pt is bipolar and not taking her meds.

## 2020-05-27 NOTE — Consult Note (Signed)
[name]   Reason for Consult: Follow-up for this 45 year old woman brought to the hospital under IVC last night. Referring Physician: Derrill KayGoodman Patient Identification: Beth Berry MRN:  [id number] Principal Diagnosis: Bipolar affective disorder, manic, severe (HCC) Diagnosis:  Principal Problem:   Bipolar affective disorder, manic, severe (HCC) Active Problems:   Schizoaffective disorder, bipolar type (HCC)   PTSD (post-traumatic stress disorder)   Cannabis abuse   Total Time spent with patient: 30 minutes  Subjective:   Beth Berry is a 45 y.o. female patient admitted with "they put me here".  HPI: Patient seen chart reviewed.  This is a 45 year old woman with a history of bipolar or schizoaffective disorder who was brought to the hospital under involuntary commitment describing erratic manic behaviors and treatment noncompliance.  Patient was agitated and showing symptoms of mania on assessment last night.  Today she is very sleepy not able to participate very much with interview.  She has not been aggressive here in the emergency room but has been cooperative with treatment.  No acute violence.  Labs reviewed.  Positive for marijuana no other major findings other than mild anemia  Past Psychiatric History: Past history of bipolar disorder.  Patient lives in [place] but had an admission here in 2020.  Unclear whether she has been getting follow-up in the interim  Risk to Self:   Risk to Others:   Prior Inpatient Therapy:   Prior Outpatient Therapy:    Past Medical History:  Past Medical History:  Diagnosis Date  . Bipolar 1 disorder (HCC)   . Contusion of great toe of right foot 06/11/2011  . Drug abuse (HCC)   . Schizophrenia [facility](HCC)     Past Surgical History:  Procedure Laterality Date  . DENTAL SURGERY     Family History:  Family History  Problem Relation Age of Onset  . Hypertension Mother   . Drug abuse Mother   . Drug  abuse Father    Family Psychiatric  History: See previous Social History:  Social History   Substance and Sexual Activity  Alcohol Use Yes   Comment: rarely     Social History   Substance and Sexual Activity  Drug Use Yes  . Types: Heroin, MDMA (Ecstacy), Cocaine   Comment: none now, hx of marijuana, narcotic, heroin, cocaine- last use 3 years    Social History   Socioeconomic History  . Marital status: Significant Other    Spouse name: Not on file  . Number of children: Not on file  . Years of education: Not on file  . Highest education level: Not on file  Occupational History  . Not on file  Tobacco Use  . Smoking status: Current Some Day Smoker    Packs/day: 1.50    Types: Cigarettes  . Smokeless tobacco: Never Used  Substance and Sexual Activity  . Alcohol use: Yes    Comment: rarely  . Drug use: Yes    Types: Heroin, MDMA (Ecstacy), Cocaine    Comment: none now, hx of marijuana, narcotic, heroin, cocaine- last use 3 years  . Sexual activity: Yes    Birth control/protection: None  Other Topics Concern  . Not on file  Social History Narrative  . Not on file   Social Determinants of Health   Financial Resource Strain: Not on file  Food Insecurity: Not on file  Transportation Needs: Not on file  Physical Activity: Not on file  Stress: Not on file  Social Connections: Not on  file   Additional Social History:    Allergies:   Allergies  Allergen Reactions  . Onion Itching  . Shellfish Allergy Hives  . Haloperidol Hives  . Lamictal [Lamotrigine] Itching and Other (See Comments)    Caused "stevens johnson syndrome"    Labs:  Results for orders placed or performed during the hospital encounter of 05/27/20 (from the past 48 hour(s))  CBC     Status: Abnormal   Collection Time: 05/27/20  2:11 AM  Result Value Ref Range   WBC 11.9 (H) 4.0 - 10.5 K/uL   RBC 3.54 (L) 3.87 - 5.11 MIL/uL   Hemoglobin 10.8 (L) 12.0 - 15.0 g/dL   HCT 26.3 (L) 78.5 - 88.5 %    MCV 94.1 80.0 - 100.0 fL   MCH 30.5 26.0 - 34.0 pg   MCHC 32.4 30.0 - 36.0 g/dL   RDW 02.7 74.1 - 28.7 %   Platelets 300 150 - 400 K/uL   nRBC 0.0 0.0 - 0.2 %    Comment: Performed at [facility], [address] [place], [place] [address]  Comprehensive metabolic panel     Status: Abnormal   Collection Time: 05/27/20  2:11 AM  Result Value Ref Range   Sodium 136 135 - 145 mmol/L   Potassium 3.5 3.5 - 5.1 mmol/L   Chloride 106 98 - 111 mmol/L   CO2 22 22 - 32 mmol/L   Glucose, Bld 105 (H) 70 - 99 mg/dL    Comment: Glucose reference range applies only to samples taken after fasting for at least 8 hours.   BUN 10 6 - 20 mg/dL   Creatinine, Ser 2.09 0.44 - 1.00 mg/dL   Calcium 8.6 (L) 8.9 - 10.3 mg/dL   Total Protein 7.0 6.5 - 8.1 g/dL   Albumin 3.6 3.5 - 5.0 g/dL   AST 19 15 - 41 U/L   ALT 17 0 - 44 U/L   Alkaline Phosphatase 72 38 - 126 U/L   Total Bilirubin 0.5 0.3 - 1.2 mg/dL   GFR, Estimated >47 >09 mL/min    Comment: (NOTE) Calculated using the CKD-EPI Creatinine Equation (2021)    Anion gap 8 5 - 15    Comment: Performed at [facility], [address] [place], [place] [address]  Ethanol     Status: None   Collection Time: 05/27/20  2:11 AM  Result Value Ref Range   Alcohol, Ethyl (B) <10 <10 mg/dL    Comment: (NOTE) Lowest detectable limit for serum alcohol is 10 mg/dL.  For medical purposes only. Performed at [facility], [address] [place], [place] [address]   Urinalysis, Complete w Microscopic     Status: Abnormal   Collection Time: 05/27/20  2:57 AM  Result Value Ref Range   Color, Urine YELLOW (A) YELLOW   APPearance CLEAR (A) CLEAR   Specific Gravity, Urine 1.006 1.005 - 1.030   pH 6.0 5.0 - 8.0   Glucose, UA NEGATIVE NEGATIVE mg/dL   Hgb urine dipstick SMALL (A) NEGATIVE   Bilirubin Urine NEGATIVE NEGATIVE   Ketones, ur NEGATIVE NEGATIVE mg/dL   Protein, ur NEGATIVE NEGATIVE mg/dL   Nitrite NEGATIVE NEGATIVE    Leukocytes,Ua NEGATIVE NEGATIVE   RBC / HPF 0-5 0 - 5 RBC/hpf   WBC, UA 0-5 0 - 5 WBC/hpf   Bacteria, UA NONE SEEN NONE SEEN   Squamous Epithelial / LPF 0-5 0 - 5   Mucus PRESENT     Comment:  Performed at [facility], [address] [place], [place] [address]  Urine Drug Screen, [facility] only)     Status: Abnormal   Collection Time: 05/27/20  2:57 AM  Result Value Ref Range   Tricyclic, Ur Screen POSITIVE (A) NONE DETECTED   Amphetamines, Ur Screen NONE DETECTED NONE DETECTED   MDMA (Ecstasy)Ur Screen NONE DETECTED NONE DETECTED   Cocaine Metabolite,Ur [place] NONE DETECTED NONE DETECTED   Opiate, Ur Screen NONE DETECTED NONE DETECTED   Phencyclidine (PCP) Ur S NONE DETECTED NONE DETECTED   Cannabinoid 50 Ng, Ur [place] POSITIVE (A) NONE DETECTED   Barbiturates, Ur Screen NONE DETECTED NONE DETECTED   Benzodiazepine, Ur Scrn NONE DETECTED NONE DETECTED   Methadone Scn, Ur NONE DETECTED NONE DETECTED    Comment: (NOTE) Tricyclics + metabolites, urine    Cutoff 1000 ng/mL Amphetamines + metabolites, urine  Cutoff 1000 ng/mL MDMA (Ecstasy), urine              Cutoff 500 ng/mL Cocaine Metabolite, urine          Cutoff 300 ng/mL Opiate + metabolites, urine        Cutoff 300 ng/mL Phencyclidine (PCP), urine         Cutoff 25 ng/mL Cannabinoid, urine                 Cutoff 50 ng/mL Barbiturates + metabolites, urine  Cutoff 200 ng/mL Benzodiazepine, urine              Cutoff 200 ng/mL Methadone, urine                   Cutoff 300 ng/mL  The urine drug screen provides only a preliminary, unconfirmed analytical test result and should not be used for non-medical purposes. Clinical consideration and professional judgment should be applied to any positive drug screen result due to possible interfering substances. A more specific alternate chemical method must be used in order to obtain a confirmed analytical result. Gas chromatography / mass spectrometry (GC/MS) is the  preferred confirm atory method. Performed at [facility], [address] [place], [place] [address]   Resp Panel by RT-PCR (Flu A&B, Covid) Nasopharyngeal Swab     Status: None   Collection Time: 05/27/20  6:43 AM   Specimen: Nasopharyngeal Swab; Nasopharyngeal(NP) swabs in vial transport medium  Result Value Ref Range   SARS Coronavirus 2 by RT PCR NEGATIVE NEGATIVE    Comment: (NOTE) SARS-CoV-2 target nucleic acids are NOT DETECTED.  The SARS-CoV-2 RNA is generally detectable in upper respiratory specimens during the acute phase of infection. The lowest concentration of SARS-CoV-2 viral copies this assay can detect is 138 copies/mL. A negative result does not preclude SARS-Cov-2 infection and should not be used as the sole basis for treatment or other patient management decisions. A negative result may occur with  improper specimen collection/handling, submission of specimen other than nasopharyngeal swab, presence of viral mutation(s) within the areas targeted by this assay, and inadequate number of viral copies(<138 copies/mL). A negative result must be combined with clinical observations, patient history, and epidemiological information. The expected result is Negative.  Fact Sheet for Patients:  BloggerCourse.com  Fact Sheet for Healthcare Providers:  SeriousBroker.it  This test is no t yet approved or cleared by the [place] FDA and  has been authorized for detection and/or diagnosis of SARS-CoV-2 by FDA under an Emergency Use Authorization (EUA). This EUA will remain  in effect (meaning this test can  be used) for the duration of the COVID-19 declaration under Section 564(b)(1) of the Act, 21 U.S.C.section 360bbb-3(b)(1), unless the authorization is terminated  or revoked sooner.       Influenza A by PCR NEGATIVE NEGATIVE   Influenza B by PCR NEGATIVE NEGATIVE    Comment: (NOTE) The Xpert Xpress  SARS-CoV-2/FLU/RSV plus assay is intended as an aid in the diagnosis of influenza from Nasopharyngeal swab specimens and should not be used as a sole basis for treatment. Nasal washings and aspirates are unacceptable for Xpert Xpress SARS-CoV-2/FLU/RSV testing.  Fact Sheet for Patients: BloggerCourse.com  Fact Sheet for Healthcare Providers: SeriousBroker.it  This test is not yet approved or cleared by the [place] FDA and has been authorized for detection and/or diagnosis of SARS-CoV-2 by FDA under an Emergency Use Authorization (EUA). This EUA will remain in effect (meaning this test can be used) for the duration of the COVID-19 declaration under Section 564(b)(1) of the Act, 21 U.S.C. section 360bbb-3(b)(1), unless the authorization is terminated or revoked.  Performed at [facility], [address], [place], [place] [address]     Current Facility-Administered Medications  Medication Dose Route Frequency Provider Last Rate Last Admin  . asenapine (SAPHRIS) sublingual tablet 10 mg  10 mg Sublingual BID Rielyn Krupinski, Jackquline Denmark, MD       Current Outpatient Medications  Medication Sig Dispense Refill  . OLANZapine (ZYPREXA) 10 MG tablet Take 10 mg by mouth at bedtime.    Marland Kitchen OLANZapine (ZYPREXA) 5 MG tablet Take 2.5 mg by mouth every morning.    . propranolol (INDERAL) 10 MG tablet Take 1 tablet (10 mg total) by mouth 2 (two) times daily. (Patient taking differently: Take 20 mg by mouth 2 (two) times daily.) 60 tablet 1  . triamcinolone cream (KENALOG) 0.5 % Apply 1 application topically 2 (two) times daily.    . Vitamin D, Ergocalciferol, (DRISDOL) 1.25 MG (50000 UNIT) CAPS capsule Take 50,000 Units by mouth every 7 (seven) days.    Marland Kitchen asenapine (SAPHRIS) 5 MG SUBL 24 hr tablet Place 2 tablets (10 mg total) under the tongue 2 (two) times daily. 60 tablet 1  . hydrOXYzine (ATARAX/VISTARIL) 25 MG tablet Take 1 tablet (25 mg  total) by mouth 2 (two) times daily. 60 tablet 1  . loratadine (CLARITIN) 10 MG tablet Take 1 tablet (10 mg total) by mouth daily. 30 tablet 1  . meloxicam (MOBIC) 15 MG tablet Take 1 tablet (15 mg total) by mouth daily. 30 tablet 1  . norgestimate-ethinyl estradiol (ORTHO-CYCLEN,SPRINTEC,PREVIFEM) 0.25-35 MG-MCG tablet Take 1 tablet by mouth daily. 1 Package 1  . traZODone (DESYREL) 50 MG tablet Take 1 tablet (50 mg total) by mouth at bedtime. 30 tablet 1    Musculoskeletal: Strength & Muscle Tone: within normal limits Gait & Station: normal Patient leans: N/A            Psychiatric Specialty Exam:  Presentation  General Appearance: Disheveled  Eye Contact:Good  Speech:Pressured  Speech Volume:Increased  Handedness:Right   Mood and Affect  Mood:Anxious; Euphoric; Irritable  Affect:Inappropriate; Full Range; Congruent   Thought Process  Thought Processes:Disorganized  Descriptions of Associations:Tangential  Orientation:Full (Time, Place and Person)  Thought Content:Delusions; Scattered; Tangential  History of Schizophrenia/Schizoaffective disorder:Yes  Duration of Psychotic Symptoms:Greater than six months  Hallucinations:Hallucinations: None  Ideas of Reference:None  Suicidal Thoughts:Suicidal Thoughts: No  Homicidal Thoughts:Homicidal Thoughts: No   Sensorium  Memory:Immediate Fair; Recent Fair; Remote Fair  Judgment:Poor  Insight:Lacking   Art therapist  Concentration:Fair  Attention Span:Fair  Recall:Fair  Fund of Knowledge:Fair  Language:Fair   Psychomotor Activity  Psychomotor Activity:Psychomotor Activity: Normal   Assets  Assets:Desire for Improvement; Social Support; Resilience   Sleep  Sleep:Sleep: Fair   Physical Exam: Physical Exam Vitals and nursing note reviewed.  Constitutional:      Appearance: Normal appearance.  HENT:     Head: Normocephalic and atraumatic.     Mouth/Throat:     Pharynx:  Oropharynx is clear.  Eyes:     Pupils: Pupils are equal, round, and reactive to light.  Cardiovascular:     Rate and Rhythm: Normal rate and regular rhythm.  Pulmonary:     Effort: Pulmonary effort is normal.     Breath sounds: Normal breath sounds.  Abdominal:     General: Abdomen is flat.     Palpations: Abdomen is soft.  Musculoskeletal:        General: Normal range of motion.  Skin:    General: Skin is warm and dry.  Neurological:     General: No focal deficit present.     Mental Status: She is alert. Mental status is at baseline.  Psychiatric:        Attention and Perception: She is inattentive.        Mood and Affect: Mood normal. Affect is blunt.        Thought Content: Thought content is paranoid.        Cognition and Memory: Memory is impaired.        Judgment: Judgment is impulsive.    Review of Systems  Unable to perform ROS: Psychiatric disorder   Blood pressure (!) 160/86, pulse 81, temperature (!) 97.5 F (36.4 C), temperature source Oral, resp. rate 16, height 5\' 6"  (1.676 m), weight 90.7 kg, SpO2 98 %. Body mass index is 32.28 kg/m.  Treatment Plan Summary: Medication management and Plan When patient was discharged in 2020 she was on Saphris.  I will put in an order to restart that.  Labs reviewed.  Other labs used in patients on antipsychotics will be ordered.  Patient is recommended for admission to the inpatient psychiatric ward.  Case reviewed with TTS and staff downstairs.  Orders will be placed.  Disposition: Recommend psychiatric Inpatient admission when medically cleared.  2021, MD 05/27/2020 11:35 AM

## 2020-05-27 NOTE — BH Assessment (Signed)
Patient is to be admitted to [facility] by Dr. Toni Amend.  Attending Physician will be Dr. Toni Amend.   Patient has been assigned to room 305, by [facility] Charge Nurse Megan.   ER staff is aware of the admission:  Glenda, ER Secretary    Lattie Corns, Patient's Nurse   Ethelene Browns, Patient Access.

## 2020-05-27 NOTE — Progress Notes (Signed)
Pt rates depression and anxiety 10/10. Pt denies SI and HI however. 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 Pt complains of relationship troubles. Pt says she has a driver's license, supportive partner and is self employed. Pt mentioned the passing of her grandmother this passed January. Pt was orieneted to the unit. Pt was educated on care plan and verbalizes understanding. Torrie Mayers RN

## 2020-05-27 NOTE — Tx Team (Signed)
Initial Treatment Plan 05/27/2020 5:34 PM Beth Berry TGP:[id number]    PATIENT STRESSORS: Financial difficulties Loss of grandmother  Marital or family conflict   PATIENT STRENGTHS: Average or above average intelligence Communication skills Supportive family/friends Work skills   PATIENT IDENTIFIED PROBLEMS: Depression     Anxiety                  DISCHARGE CRITERIA:  Adequate post-discharge living arrangements Improved stabilization in mood, thinking, and/or behavior Need for constant or close observation no longer present Verbal commitment to aftercare and medication compliance  PRELIMINARY DISCHARGE PLAN: Outpatient therapy Return to previous living arrangement Return to previous work or school arrangements  PATIENT/FAMILY INVOLVEMENT: This treatment plan has been presented to and reviewed with the patient, Beth Berry. The patient has been given the opportunity to ask questions and make suggestions.  Chalmers Cater, RN 05/27/2020, 5:34 PM

## 2020-05-27 NOTE — ED Notes (Signed)
IVC pending placement moved to [id number] [phone]

## 2020-05-27 NOTE — ED Notes (Signed)
Annice Pih NP at bedside

## 2020-05-28 DIAGNOSIS — F3113 Bipolar disorder, current episode manic without psychotic features, severe: Secondary | ICD-10-CM | POA: Diagnosis not present

## 2020-05-28 LAB — LIPID PANEL
Cholesterol: 140 mg/dL (ref 0–200)
HDL: 40 mg/dL — ABNORMAL LOW (ref >40)
LDL Cholesterol: 85 mg/dL (ref 0–99)
Total CHOL/HDL Ratio: 3.5 RATIO
Triglycerides: 73 mg/dL (ref <150)
VLDL: 15 mg/dL (ref 0–40)

## 2020-05-28 LAB — HEMOGLOBIN A1C
Hgb A1c MFr Bld: 5.2 % (ref 4.8–5.6)
Mean Plasma Glucose: 102.54 mg/dL

## 2020-05-28 MED ORDER — ZIPRASIDONE HCL 20 MG PO CAPS
20.0000 mg | ORAL_CAPSULE | Freq: Two times a day (BID) | ORAL | Status: DC
  Administered 2020-05-28 – 2020-05-29 (×2): 20 mg via ORAL
  Filled 2020-05-28 (×2): qty 1

## 2020-05-28 NOTE — Tx Team (Addendum)
Interdisciplinary Treatment and Diagnostic Plan Update  05/28/2020 Time of Session: 09:00AM Beth Berry MRN: [id number]  Principal Diagnosis: <principal problem not specified>  Secondary Diagnoses: Active Problems:   Bipolar 1 disorder, manic, full remission ([place])   Current Medications:  Current Facility-Administered Medications  Medication Dose Route Frequency Provider Last Rate Last Admin  . acetaminophen (TYLENOL) tablet 650 mg  650 mg Oral Q6H PRN Clapacs, John T, MD      . alum & mag hydroxide-simeth (MAALOX/MYLANTA) 200-200-20 MG/5ML suspension 30 mL  30 mL Oral Q4H PRN Clapacs, John T, MD      . hydrOXYzine (ATARAX/VISTARIL) tablet 50 mg  50 mg Oral TID PRN Clapacs, Madie Reno, MD   50 mg at 05/27/20 1841  . magnesium hydroxide (MILK OF MAGNESIA) suspension 30 mL  30 mL Oral Daily PRN Clapacs, John T, MD      . propranolol (INDERAL) tablet 10 mg  10 mg Oral BID Salley Scarlet, MD   10 mg at 05/28/20 0806  . QUEtiapine (SEROQUEL XR) 24 hr tablet 300 mg  300 mg Oral QHS Salley Scarlet, MD   300 mg at 05/27/20 2204  . temazepam (RESTORIL) capsule 15 mg  15 mg Oral QHS PRN Salley Scarlet, MD      . topiramate (TOPAMAX) tablet 25 mg  25 mg Oral BID Salley Scarlet, MD   25 mg at 05/28/20 0805  . ziprasidone (GEODON) capsule 20 mg  20 mg Oral Q8H PRN Salley Scarlet, MD   20 mg at 05/28/20 0805  . ziprasidone (GEODON) injection 20 mg  20 mg Intramuscular Q8H PRN Salley Scarlet, MD       PTA Medications: Medications Prior to Admission  Medication Sig Dispense Refill Last Dose  . asenapine (SAPHRIS) 5 MG SUBL 24 hr tablet Place 2 tablets (10 mg total) under the tongue 2 (two) times daily. 60 tablet 1   . hydrOXYzine (ATARAX/VISTARIL) 25 MG tablet Take 1 tablet (25 mg total) by mouth 2 (two) times daily. 60 tablet 1   . loratadine (CLARITIN) 10 MG tablet Take 1 tablet (10 mg total) by mouth daily. 30 tablet 1   . meloxicam (MOBIC) 15 MG tablet Take 1 tablet (15 mg total)  by mouth daily. 30 tablet 1   . norgestimate-ethinyl estradiol (ORTHO-CYCLEN,SPRINTEC,PREVIFEM) 0.25-35 MG-MCG tablet Take 1 tablet by mouth daily. 1 Package 1   . OLANZapine (ZYPREXA) 10 MG tablet Take 10 mg by mouth at bedtime.     Marland Kitchen OLANZapine (ZYPREXA) 5 MG tablet Take 2.5 mg by mouth every morning.     . propranolol (INDERAL) 10 MG tablet Take 1 tablet (10 mg total) by mouth 2 (two) times daily. (Patient taking differently: Take 20 mg by mouth 2 (two) times daily.) 60 tablet 1   . traZODone (DESYREL) 50 MG tablet Take 1 tablet (50 mg total) by mouth at bedtime. 30 tablet 1   . triamcinolone cream (KENALOG) 0.5 % Apply 1 application topically 2 (two) times daily.     . Vitamin D, Ergocalciferol, (DRISDOL) 1.25 MG (50000 UNIT) CAPS capsule Take 50,000 Units by mouth every 7 (seven) days.       Patient Stressors: Financial difficulties Loss of grandmother  Marital or family conflict  Patient Strengths: Average or above average intelligence Communication skills Supportive family/friends Work skills  Treatment Modalities: Medication Management, Group therapy, Case management,  1 to 1 session with clinician, Psychoeducation, Recreational therapy.   Physician Treatment Plan for Primary  Diagnosis: <principal problem not specified> Long Term Goal(s):     Short Term Goals:    Medication Management: Evaluate patient's response, side effects, and tolerance of medication regimen.  Therapeutic Interventions: 1 to 1 sessions, Unit Group sessions and Medication administration.  Evaluation of Outcomes: Not Met  Physician Treatment Plan for Secondary Diagnosis: Active Problems:   Bipolar 1 disorder, manic, full remission ([place])  Long Term Goal(s):     Short Term Goals:       Medication Management: Evaluate patient's response, side effects, and tolerance of medication regimen.  Therapeutic Interventions: 1 to 1 sessions, Unit Group sessions and Medication administration.  Evaluation of  Outcomes: Not Met   RN Treatment Plan for Primary Diagnosis: <principal problem not specified> Long Term Goal(s): Knowledge of disease and therapeutic regimen to maintain health will improve  Short Term Goals: Ability to remain free from injury will improve, Ability to verbalize frustration and anger appropriately will improve, Ability to demonstrate self-control, Ability to participate in decision making will improve, Ability to verbalize feelings will improve, Ability to disclose and discuss suicidal ideas, Ability to identify and develop effective coping behaviors will improve and Compliance with prescribed medications will improve  Medication Management: RN will administer medications as ordered by provider, will assess and evaluate patient's response and provide education to patient for prescribed medication. RN will report any adverse and/or side effects to prescribing provider.  Therapeutic Interventions: 1 on 1 counseling sessions, Psychoeducation, Medication administration, Evaluate responses to treatment, Monitor vital signs and CBGs as ordered, Perform/monitor CIWA, COWS, AIMS and Fall Risk screenings as ordered, Perform wound care treatments as ordered.  Evaluation of Outcomes: Not Met   LCSW Treatment Plan for Primary Diagnosis: <principal problem not specified> Long Term Goal(s): Safe transition to appropriate next level of care at discharge, Engage patient in therapeutic group addressing interpersonal concerns.  Short Term Goals: Engage patient in aftercare planning with referrals and resources, Increase social support, Increase ability to appropriately verbalize feelings, Increase emotional regulation, Facilitate acceptance of mental health diagnosis and concerns, Identify triggers associated with mental health/substance abuse issues and Increase skills for wellness and recovery  Therapeutic Interventions: Assess for all discharge needs, 1 to 1 time with Social worker, Explore  available resources and support systems, Assess for adequacy in community support network, Educate family and significant other(s) on suicide prevention, Complete Psychosocial Assessment, Interpersonal group therapy.  Evaluation of Outcomes: Not Met   Progress in Treatment: Attending groups: No. Participating in groups: No. Taking medication as prescribed: Yes. Toleration medication: Yes. Family/Significant other contact made: No, will contact:  pending pt permission Patient understands diagnosis: Yes. Discussing patient identified problems/goals with staff: Yes. Medical problems stabilized or resolved: No. Denies suicidal/homicidal ideation: No. Issues/concerns per patient self-inventory: No. Other: None  New problem(s) identified: No, Describe:  None  New Short Term/Long Term Goal(s): Engage patient in aftercare planning with referrals and resources, Increase social support, Increase ability to appropriately verbalize feelings, Increase emotional regulation, Facilitate acceptance of mental health diagnosis and concerns, Identify triggers associated with mental health/substance abuse issues and Increase skills for wellness and recovery  Patient Goals:  "Path to leaving and go outside. Allergy medicine."  Discharge Plan or Barriers: CSW will assist pt in obtaining transportation and follow-up care upon discharge.   Reason for Continuation of Hospitalization: Aggression Depression Mania Medication stabilization Suicidal ideation  Estimated Length of Stay: 1-7 days   Recreational Therapy: Patient Stressors: N/A Patient Goal: Patient will engage in groups without prompting or encouragement  from LRT x3 group sessions within 5 recreation therapy group sessions.  Attendees: Patient: Aspin L. Pacey 05/28/2020 9:36 AM  Physician: Salley Scarlet, MD 05/28/2020 9:36 AM  Nursing: Marla Roe, RN  05/28/2020 9:36 AM  RN Care Manager: 05/28/2020 9:36 AM  Social Worker: Hedy Camara "Robbie"  [place], MSW, [place], LCAS 05/28/2020 9:36 AM  Recreational Therapist: Roanna Epley, Reather Converse, LRT  05/28/2020 9:36 AM  Other: Kiva Martinique, MSW, LCSW-A 05/28/2020 9:36 AM  Other:  05/28/2020 9:36 AM  Other: 05/28/2020 9:36 AM    Scribe for Treatment Team: Kiva A Martin[place], [place] 05/28/2020 9:36 AM

## 2020-05-28 NOTE — Progress Notes (Signed)
Pt has been alert and oriented to person, place, time and situation. Pt is calm, cooperative, denies suicidal and homicidal ideation. Pt denies depression and reports anxiety. Pt is displaying manic symptoms, is easily irritable, hyperactive, reports racing thoughts, thoughts also noted to be tangential. Pt is restless, impulsive, became agitated at one point and punched a wall, no injury noted. Pt is medication complaint and was given PRN medication for agitation. MD/unit psychiatrist and charge RN notified regarding pt's agitation. Pt is dismissive when the MD attempted to talk with pt, and pt also makes inappropriate racial slurs about the doctor. Pt is focused on discharge. Pt stripped her bed and asked for new linen which was provided. Will continue to monitor pt per Q15 minute face checks and monitor for safety and progress.
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
## 2020-05-28 NOTE — BHH Counselor (Signed)
CSW attempted to meet with pt to complete the assessment. During the time that pt was answering questions her thoughts were tangential and difficult to follow. Pt did give verbal consent to contact her emergency contacts. Pt was answering questions initially but then became agitated and stated that this was a waste of her time. She stated that she had been lying the entire time and none of the people that she mentioned were real. Pt even stated that she was not real, implying that her name was not hers. Pt stated that she needed to go wash her clothes and that she was done with this conversation. CSW stated that he would let tech know that pt was ready to wash her clothes. Patient agreed. No other concerns expressed. Contact ended without incident.  Tech notified of pt desire to wash clothes and this was addressed.   Vilma Meckel. Algis Greenhouse, MSW, LCSW, LCAS 05/28/2020 11:36 AM

## 2020-05-28 NOTE — [facility] Group Notes (Signed)
LCSW Group Therapy Note  05/28/2020 2:12 PM  Type of Therapy and Topic:  Group Therapy:  Feelings around Relapse and Recovery  Participation Level:  Did Not Attend   Description of Group:    Patients in this group will discuss emotions they experience before and after a relapse. They will process how experiencing these feelings, or avoidance of experiencing them, relates to having a relapse. Facilitator will guide patients to explore emotions they have related to recovery. Patients will be encouraged to process which emotions are more powerful. They will be guided to discuss the emotional reaction significant others in their lives may have to their relapse or recovery. Patients will be assisted in exploring ways to respond to the emotions of others without this contributing to a relapse.  Therapeutic Goals: 1. Patient will identify two or more emotions that lead to a relapse for them 2. Patient will identify two emotions that result when they relapse 3. Patient will identify two emotions related to recovery 4. Patient will demonstrate ability to communicate their needs through discussion and/or role plays   Summary of Patient Progress: X  Therapeutic Modalities:   Cognitive Behavioral Therapy Solution-Focused Therapy Assertiveness Training Relapse Prevention Therapy   Marieke Lubke R. Maragret Vanacker, MSW, LCSW, LCAS 05/28/2020 2:12 PM   

## 2020-05-28 NOTE — Progress Notes (Signed)
Recreation Therapy Notes    Date: 05/28/2020  Time: 9:30 am   Location: Court yard     Behavioral response: N/A   Intervention Topic: Social- Skills    Discussion/Intervention: Patient did not attend group.   Clinical Observations/Feedback:  Patient did not attend group.   Quaneshia Wareing LRT/CTRS        Ketih Goodie 05/28/2020 11:21 AM
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
## 2020-05-29 DIAGNOSIS — F3113 Bipolar disorder, current episode manic without psychotic features, severe: Secondary | ICD-10-CM | POA: Diagnosis not present

## 2020-05-29 MED ORDER — ZIPRASIDONE HCL 40 MG PO CAPS
40.0000 mg | ORAL_CAPSULE | Freq: Two times a day (BID) | ORAL | Status: DC
  Administered 2020-05-29 – 2020-05-31 (×4): 40 mg via ORAL
  Filled 2020-05-29 (×4): qty 1

## 2020-05-29 NOTE — Plan of Care (Signed)
Patient has been active in the milieu and pleasant on approach. Currently attending group.

## 2020-05-29 NOTE — Progress Notes (Signed)
Patient alert and oriented x 4, affect is flat thoughts are disorganized at times, speech is tangential, she appears responding to internal stimuli although denies SI/HI/AVH, she was receptive to staff, easily redirectable, she was not interacting appropriately with peers in the dayroom, she eventually isolated to self in her room, she was complaint with medication regimen, 15 minutes safety checks maintained, will continue to monitor.

## 2020-05-29 NOTE — Progress Notes (Addendum)
[facility] MD Progress Note  05/29/2020 12:47 PM Beth Berry  MRN:  [id number]  Principal Problem: Bipolar affective disorder, manic, severe (HCC) Diagnosis: Principal Problem:   Bipolar affective disorder, manic, severe (HCC) Active Problems:   PTSD (post-traumatic stress disorder)   Cannabis abuse   Non compliance w medication regimen  Beth Berry is a 45 y.o. female patient with a history of bipolar disorder vs schizoaffective disorder, who admitted to the [facility] unit for treatment of acute mania in settings of medication noncompliance.  Interval History Patient was seen today for re-evaluation.  Nursing reports no events overnight. The patient has no issues with performing ADLs.  Patient has been medication compliant.    Subjective:  On assessment patient reports she does not belong here and is willing to be discharged as soon as possible as she has to take care of some bills at home. She reports that she does not require inpatient psych admission and it was made up by her God sister who is her payee, because "she is greedy". Patient is very focused on her payee and talks about her for several minutes. She is also talking about her family and how they cannot accept someone who is homosexual. She dennies any complaints and asks about discharge. She denies feeling depressed. She denies feeling suicidal or homicidal, although admits to feeling angry at someone outside. She denies auditory/visual hallucinations. The patient reports no side effects from medications and is agreeable to increase the dose of Geodon. She is asking about Clonazepam for anxiety; she is educated about ordered alternative medications for anxiety.  Labs: no new results for review.   Total Time spent with patient: 20 minutes  Past Psychiatric History: see H&P  Past Medical History:  Past Medical History:  Diagnosis Date  . Bipolar 1 disorder (HCC)   . Contusion of great toe of right foot 06/11/2011  . Drug abuse (HCC)    . Schizophrenia [facility])     Past Surgical History:  Procedure Laterality Date  . DENTAL SURGERY     Family History:  Family History  Problem Relation Age of Onset  . Hypertension Mother   . Drug abuse Mother   . Drug abuse Father    Family Psychiatric  History:  Social History:  Social History   Substance and Sexual Activity  Alcohol Use Yes   Comment: rarely     Social History   Substance and Sexual Activity  Drug Use Yes  . Types: Heroin, MDMA (Ecstacy), Cocaine   Comment: none now, hx of marijuana, narcotic, heroin, cocaine- last use 3 years    Social History   Socioeconomic History  . Marital status: Significant Other    Spouse name: Not on file  . Number of children: Not on file  . Years of education: Not on file  . Highest education level: Not on file  Occupational History  . Not on file  Tobacco Use  . Smoking status: Current Some Day Smoker    Packs/day: 1.50    Types: Cigarettes  . Smokeless tobacco: Never Used  Substance and Sexual Activity  . Alcohol use: Yes    Comment: rarely  . Drug use: Yes    Types: Heroin, MDMA (Ecstacy), Cocaine    Comment: none now, hx of marijuana, narcotic, heroin, cocaine- last use 3 years  . Sexual activity: Yes    Birth control/protection: None  Other Topics Concern  . Not on file  Social History Narrative  . Not on file  Social Determinants of Health   Financial Resource Strain: Not on file  Food Insecurity: Not on file  Transportation Needs: Not on file  Physical Activity: Not on file  Stress: Not on file  Social Connections: Not on file   Additional Social History:                         Sleep: Fair  Appetite:  Fair  Current Medications: Current Facility-Administered Medications  Medication Dose Route Frequency Provider Last Rate Last Admin  . acetaminophen (TYLENOL) tablet 650 mg  650 mg Oral Q6H PRN Clapacs, John T, MD      . alum & mag hydroxide-simeth (MAALOX/MYLANTA) 200-200-20  MG/5ML suspension 30 mL  30 mL Oral Q4H PRN Clapacs, John T, MD      . hydrOXYzine (ATARAX/VISTARIL) tablet 50 mg  50 mg Oral TID PRN Clapacs, Jackquline Denmark, MD   50 mg at 05/29/20 0525  . magnesium hydroxide (MILK OF MAGNESIA) suspension 30 mL  30 mL Oral Daily PRN Clapacs, John T, MD      . propranolol (INDERAL) tablet 10 mg  10 mg Oral BID Jesse Sans, MD   10 mg at 05/29/20 5329  . temazepam (RESTORIL) capsule 15 mg  15 mg Oral QHS PRN Jesse Sans, MD   15 mg at 05/28/20 2125  . topiramate (TOPAMAX) tablet 25 mg  25 mg Oral BID Jesse Sans, MD   25 mg at 05/29/20 9242  . ziprasidone (GEODON) capsule 20 mg  20 mg Oral Q8H PRN Jesse Sans, MD   20 mg at 05/28/20 0805  . ziprasidone (GEODON) capsule 40 mg  40 mg Oral BID WC Abisai Coble, Serina Cowper, MD      . ziprasidone (GEODON) injection 20 mg  20 mg Intramuscular Q8H PRN Jesse Sans, MD        Lab Results:  Results for orders placed or performed during the hospital encounter of 05/27/20 (from the past 48 hour(s))  Hemoglobin A1c     Status: None   Collection Time: 05/28/20  6:42 AM  Result Value Ref Range   Hgb A1c MFr Bld 5.2 4.8 - 5.6 %    Comment: (NOTE) Pre diabetes:          5.7%-6.4%  Diabetes:              >6.4%  Glycemic control for   <7.0% adults with diabetes    Mean Plasma Glucose 102.54 mg/dL    Comment: Performed at [facility], [address] [place], [place] [address]  Lipid panel     Status: Abnormal   Collection Time: 05/28/20  6:42 AM  Result Value Ref Range   Cholesterol 140 0 - 200 mg/dL   Triglycerides 73 <962 mg/dL   HDL 40 (L) >22 mg/dL   Total CHOL/HDL Ratio 3.5 RATIO   VLDL 15 0 - 40 mg/dL   LDL Cholesterol 85 0 - 99 mg/dL    Comment:        Total Cholesterol/HDL:CHD Risk Coronary Heart Disease Risk Table                     Men   Women  1/2 Average Risk   3.4   3.3  Average Risk       5.0   4.4  2 X Average Risk   9.6   7.1  3 X Average Risk  23.4  11.0        Use the  calculated Patient Ratio above and the CHD Risk Table to determine the patient's CHD Risk.        ATP III CLASSIFICATION (LDL):  <100     mg/dL   Optimal  [id number]  mg/dL   Near or Above                    Optimal  130-159  mg/dL   Borderline  [id number]  mg/dL   High  >440>190     mg/dL   Very High Performed at [facility], [address] [place], [place] [id number]     Blood Alcohol level:  Lab Results  Component Value Date   [facility] 05/27/2020   ETH <10 04/06/2018    Metabolic Disorder Labs: Lab Results  Component Value Date   HGBA1C 5.2 05/28/2020   MPG 102.54 05/28/2020   MPG 116.89 04/08/2018   No results found for: PROLACTIN Lab Results  Component Value Date   CHOL 140 05/28/2020   TRIG 73 05/28/2020   HDL 40 (L) 05/28/2020   CHOLHDL 3.5 05/28/2020   VLDL 15 05/28/2020   LDLCALC 85 05/28/2020   LDLCALC 73 04/08/2018    Physical Findings: AIMS:  , ,  ,  ,    CIWA:    COWS:     Musculoskeletal: Strength & Muscle Tone: within normal limits Gait & Station: normal Patient leans: N/A  Psychiatric Specialty Exam:  Presentation  General Appearance: Disheveled  Eye Contact:Other (comment) (Intense, glaring)  Speech:Pressured  Speech Volume:Increased  Handedness:Right   Mood and Affect  Mood:Anxious; Irritable; Labile  Affect:Inappropriate   Thought Process  Thought Processes:Disorganized  Descriptions of Associations:Tangential  Orientation:Full (Time, Place and Person)  Thought Content:Paranoid Ideation; Delusions; Tangential  History of Schizophrenia/Schizoaffective disorder:Yes  Duration of Psychotic Symptoms:Greater than six months  Hallucinations:Hallucinations: None  Ideas of Reference:Paranoia; Percusatory  Suicidal Thoughts: denies today  Homicidal Thoughts:Homicidal Thoughts: Yes, Passive   Sensorium  Memory:Immediate Fair; Recent Fair; Remote Fair  Judgment:Poor  Insight:Lacking   Executive Functions   Concentration:Poor  Attention Span:Poor  Recall:Fair  Fund of Knowledge:Fair  Language:Fair   Psychomotor Activity  Psychomotor Activity:Psychomotor Activity: Increased; Restlessness   Assets  Assets:Desire for Improvement; Social Support; Resilience   Sleep  Sleep:Sleep: Fair Number of Hours of Sleep: 7.5    Physical Exam: Physical Exam ROS Blood pressure 128/83, pulse 88, temperature 98.1 F (36.7 C), temperature source Oral, resp. rate 18, height 5\' 7"  (1.702 m), weight 95.3 kg, SpO2 98 %. Body mass index is 32.89 kg/m.   Treatment Plan Summary: Daily contact with patient to assess and evaluate symptoms and progress in treatment and Medication management   Patient is a 45 year old female with the above-stated past psychiatric history who is seen in follow-up.  Chart reviewed. Patient discussed with nursing. Patient remains acutely manic. She is focused on discharge; she expresses delusions and possible homicidal thoughts. Objectively, she remains agitated, irritable, tangential, with racing thoughts and pressured speech. Will increase the dose of Geodon for acute mania.   Plan:  -continue inpatient psych admission; 15-minute checks; daily contact with patient to assess and evaluate symptoms and progress in treatment; psychoeducation.    -Scheduled Meds: Geodon increased today.  . propranolol  10 mg Oral BID  . topiramate  25 mg Oral BID  . ziprasidone  40 mg Oral BID WC   PRN Meds:.acetaminophen, alum & mag hydroxide-simeth, hydrOXYzine, magnesium hydroxide, temazepam, ziprasidone,  ziprasidone  -Pertinent Labs: no new labs ordered today  -EKG order placed on admission, not done yet.    -Consults: No new consults placed since yesterday    -Disposition: Patient is not ready for discharge yet. All necessary aftercare will be arranged prior to discharge.   -  I certify that the patient does need, on a daily basis, active treatment furnished directly by or  requiring the supervision of inpatient psychiatric facility personnel.     Thalia Party, MD 05/29/2020, 12:47 PM

## 2020-05-29 NOTE — Progress Notes (Signed)
Patient has stayed in the milieu. Cooperative and pleasant. She continues to experience difficulty with her thought process: she has tangential thoughts and flight of ideas. Patient took all her medications as scheduled, voluntarily. She ate her meals and participated in group activities. Patient used the phone multiple times. Had no major concerns throughout this shift.

## 2020-05-29 NOTE — Progress Notes (Signed)
[facility] LCSW Group Therapy Note  Date/Time:  05/29/2020 1:19-2:03PM  Type of Therapy and Topic:  Group Therapy:  Healthy and Unhealthy Supports  Participation Level:  Active   Description of Group:  Patients in this group were introduced to the idea of adding a variety of healthy supports to address the various needs in their lives.Patients discussed what additional healthy supports could be helpful in their recovery and wellness after discharge in order to prevent future hospitalizations.   An emphasis was placed on using counselor, doctor, therapy groups, 12-step groups, and problem-specific support groups to expand supports.  They also worked as a group on developing a specific plan for several patients to deal with unhealthy supports through boundary-setting, psychoeducation with loved ones, and even termination of relationships.   Therapeutic Goals:   1)  discuss importance of adding supports to stay well once out of the hospital  2)  compare healthy versus unhealthy supports and identify some examples of each  3)  generate ideas and descriptions of healthy supports that can be added  4)  offer mutual support about how to address unhealthy supports  5)  encourage active participation in and adherence to discharge plan    Summary of Patient Progress:  Patient spoke about her dog, mother, fiance, and CSW as a support to her. Patient at times got off topic and spoke about things going on in her life. Patient also wanted to know how she can change her payee person. Patient also stated she likes to dance, sing, and rap.   Therapeutic Modalities:   Motivational Interviewing Brief Solution-Focused Therapy  Susa Simmonds, Theresia Majors 05/29/2020  3:11 PM

## 2020-05-29 NOTE — Progress Notes (Signed)
Patient has been socializing with same age/same sex peer. Has spent a lot of time on the phone. Denies SI, HI and AVH. Requested and received temazepam at hs for sleep and Tylenol for headache with relief.
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
## 2020-05-30 DIAGNOSIS — F3113 Bipolar disorder, current episode manic without psychotic features, severe: Secondary | ICD-10-CM | POA: Diagnosis not present

## 2020-05-30 NOTE — Plan of Care (Signed)
  Problem: Education: Goal: Knowledge of [facility] Education information/materials will improve Outcome: Progressing Goal: Emotional status will improve Outcome: Progressing Goal: Mental status will improve Outcome: Progressing Goal: Verbalization of understanding the information provided will improve Outcome: Progressing   Problem: Activity: Goal: Interest or engagement in activities will improve Outcome: Progressing Goal: Sleeping patterns will improve Outcome: Progressing   Problem: Coping: Goal: Ability to verbalize frustrations and anger appropriately will improve Outcome: Progressing Goal: Ability to demonstrate self-control will improve Outcome: Progressing   Problem: Health Behavior/Discharge Planning: Goal: Identification of resources available to assist in meeting health care needs will improve Outcome: Progressing Goal: Compliance with treatment plan for underlying cause of condition will improve Outcome: Progressing   Problem: Physical Regulation: Goal: Ability to maintain clinical measurements within normal limits will improve Outcome: Progressing   Problem: Safety: Goal: Periods of time without injury will increase Outcome: Progressing   Problem: Education: Goal: Utilization of techniques to improve thought processes will improve Outcome: Progressing Goal: Knowledge of the prescribed therapeutic regimen will improve Outcome: Progressing   Problem: Activity: Goal: Interest or engagement in leisure activities will improve Outcome: Progressing Goal: Imbalance in normal sleep/wake cycle will improve Outcome: Progressing   Problem: Coping: Goal: Coping ability will improve Outcome: Progressing Goal: Will verbalize feelings Outcome: Progressing   Problem: Health Behavior/Discharge Planning: Goal: Ability to make decisions will improve Outcome: Progressing Goal: Compliance with therapeutic regimen will improve Outcome: Progressing    Problem: Role Relationship: Goal: Will demonstrate positive changes in social behaviors and relationships Outcome: Progressing   Problem: Safety: Goal: Ability to disclose and discuss suicidal ideas will improve Outcome: Progressing Goal: Ability to identify and utilize support systems that promote safety will improve Outcome: Progressing   Problem: Self-Concept: Goal: Will verbalize positive feelings about self Outcome: Progressing Goal: Level of anxiety will decrease Outcome: Progressing   Problem: Education: Goal: Ability to state activities that reduce stress will improve Outcome: Progressing   Problem: Coping: Goal: Ability to identify and develop effective coping behavior will improve Outcome: Progressing   Problem: Self-Concept: Goal: Ability to identify factors that promote anxiety will improve Outcome: Progressing Goal: Level of anxiety will decrease Outcome: Progressing Goal: Ability to modify response to factors that promote anxiety will improve Outcome: Progressing   

## 2020-05-30 NOTE — [facility] Suicide Risk Assessment (Signed)
[facility] INPATIENT:  Family/Significant Other Suicide Prevention Education  Suicide Prevention Education:  Contact Attempts: Beth Berry, (Mother) has been identified by the patient as the family member/significant other with whom the patient will be residing, and identified as the person(s) who will aid the patient in the event of a mental health crisis.  With written consent from the patient, two attempts were made to provide suicide prevention education, prior to and/or following the patient's discharge.  We were unsuccessful in providing suicide prevention education.  A suicide education pamphlet was given to the patient to share with family/significant other.  Date and time of first attempt: 05/30/20/2:43 PM Date and time of second attempt:  Beth Berry 05/30/2020, 2:45 PM

## 2020-05-30 NOTE — Progress Notes (Signed)
[facility] MD Progress Note  05/30/2020 10:12 AM Beth Berry  MRN:  [id number]  Principal Problem: Bipolar affective disorder, manic, severe (HCC) Diagnosis: Principal Problem:   Bipolar affective disorder, manic, severe (HCC) Active Problems:   PTSD (post-traumatic stress disorder)   Cannabis abuse   Non compliance w medication regimen  Beth Berry is a 45 y.o. female patient with a history of bipolar disorder vs schizoaffective disorder, who admitted to the [facility] unit for treatment of acute mania in settings of medication noncompliance.  Interval History Patient was seen today for re-evaluation.  Nursing reports no events overnight. The patient has no issues with performing ADLs.  Patient has been medication compliant.    Subjective:  Patient remains restless, overly talkative. Still preoccupied with the idea that her payee is misusing her money and taking advantage of her and the whole family. Wants to be discharges. No insight in her condition. She is compliant with all medications though, denies any side effects from meds. She denies any mental or physical complaints. She denies feeling depressed. She denies feeling suicidal. She is very angry at her payee, but denies homicidal thoughts towards her or others.  She denies auditory/visual hallucinations.  Labs: no new results for review.   Total Time spent with patient: 20 minutes  Past Psychiatric History: see H&P  Past Medical History:  Past Medical History:  Diagnosis Date  . Bipolar 1 disorder (HCC)   . Contusion of great toe of right foot 06/11/2011  . Drug abuse (HCC)   . Schizophrenia [facility])     Past Surgical History:  Procedure Laterality Date  . DENTAL SURGERY     Family History:  Family History  Problem Relation Age of Onset  . Hypertension Mother   . Drug abuse Mother   . Drug abuse Father    Family Psychiatric  History:  Social History:  Social History   Substance and Sexual Activity  Alcohol Use Yes    Comment: rarely     Social History   Substance and Sexual Activity  Drug Use Yes  . Types: Heroin, MDMA (Ecstacy), Cocaine   Comment: none now, hx of marijuana, narcotic, heroin, cocaine- last use 3 years    Social History   Socioeconomic History  . Marital status: Significant Other    Spouse name: Not on file  . Number of children: Not on file  . Years of education: Not on file  . Highest education level: Not on file  Occupational History  . Not on file  Tobacco Use  . Smoking status: Current Some Day Smoker    Packs/day: 1.50    Types: Cigarettes  . Smokeless tobacco: Never Used  Substance and Sexual Activity  . Alcohol use: Yes    Comment: rarely  . Drug use: Yes    Types: Heroin, MDMA (Ecstacy), Cocaine    Comment: none now, hx of marijuana, narcotic, heroin, cocaine- last use 3 years  . Sexual activity: Yes    Birth control/protection: None  Other Topics Concern  . Not on file  Social History Narrative  . Not on file   Social Determinants of Health   Financial Resource Strain: Not on file  Food Insecurity: Not on file  Transportation Needs: Not on file  Physical Activity: Not on file  Stress: Not on file  Social Connections: Not on file   Additional Social History:  Sleep: Fair  Appetite:  Fair  Current Medications: Current Facility-Administered Medications  Medication Dose Route Frequency Provider Last Rate Last Admin  . acetaminophen (TYLENOL) tablet 650 mg  650 mg Oral Q6H PRN Clapacs, Jackquline Denmark, MD   650 mg at 05/29/20 2107  . alum & mag hydroxide-simeth (MAALOX/MYLANTA) 200-200-20 MG/5ML suspension 30 mL  30 mL Oral Q4H PRN Clapacs, John T, MD      . hydrOXYzine (ATARAX/VISTARIL) tablet 50 mg  50 mg Oral TID PRN Clapacs, Jackquline Denmark, MD   50 mg at 05/29/20 0525  . magnesium hydroxide (MILK OF MAGNESIA) suspension 30 mL  30 mL Oral Daily PRN Clapacs, John T, MD      . propranolol (INDERAL) tablet 10 mg  10 mg Oral BID  Jesse Sans, MD   10 mg at 05/30/20 8413  . temazepam (RESTORIL) capsule 15 mg  15 mg Oral QHS PRN Jesse Sans, MD   15 mg at 05/29/20 2106  . topiramate (TOPAMAX) tablet 25 mg  25 mg Oral BID Jesse Sans, MD   25 mg at 05/30/20 2440  . ziprasidone (GEODON) capsule 20 mg  20 mg Oral Q8H PRN Jesse Sans, MD   20 mg at 05/28/20 0805  . ziprasidone (GEODON) capsule 40 mg  40 mg Oral BID WC Thalia Party, MD   40 mg at 05/30/20 0821  . ziprasidone (GEODON) injection 20 mg  20 mg Intramuscular Q8H PRN Jesse Sans, MD        Lab Results:  No results found for this or any previous visit (from the past 48 hour(s)).  Blood Alcohol level:  Lab Results  Component Value Date   ETH <10 05/27/2020   ETH <10 04/06/2018    Metabolic Disorder Labs: Lab Results  Component Value Date   HGBA1C 5.2 05/28/2020   MPG 102.54 05/28/2020   MPG 116.89 04/08/2018   No results found for: PROLACTIN Lab Results  Component Value Date   CHOL 140 05/28/2020   TRIG 73 05/28/2020   HDL 40 (L) 05/28/2020   CHOLHDL 3.5 05/28/2020   VLDL 15 05/28/2020   LDLCALC 85 05/28/2020   LDLCALC 73 04/08/2018    Physical Findings: AIMS:  , ,  ,  ,    CIWA:    COWS:     Musculoskeletal: Strength & Muscle Tone: within normal limits Gait & Station: normal Patient leans: N/A  Psychiatric Specialty Exam:  Presentation  General Appearance: Disheveled  Eye Contact:Other (comment) (Intense, glaring)  Speech:Pressured  Speech Volume:Increased  Handedness:Right   Mood and Affect  Mood:Anxious; Irritable; Labile  Affect:Inappropriate   Thought Process  Thought Processes:Disorganized  Descriptions of Associations:Tangential  Orientation:Full (Time, Place and Person)  Thought Content:Paranoid Ideation; Delusions; Tangential  History of Schizophrenia/Schizoaffective disorder:Yes  Duration of Psychotic Symptoms:Greater than six months  Hallucinations:No data recorded  Ideas  of Reference:Paranoia; Percusatory  Suicidal Thoughts: denies today  Homicidal Thoughts:No data recorded   Sensorium  Memory:Immediate Fair; Recent Fair; Remote Fair  Judgment:Poor  Insight:Lacking   Executive Functions  Concentration:Poor  Attention Span:Poor  Recall:Fair  Fund of Knowledge:Fair  Language:Fair   Psychomotor Activity  Psychomotor Activity:No data recorded   Assets  Assets:Desire for Improvement; Social Support; Resilience   Sleep  Sleep:No data recorded    Physical Exam: Physical Exam  ROS  Blood pressure 116/73, pulse 83, temperature 97.6 F (36.4 C), temperature source Oral, resp. rate 18, height 5\' 7"  (1.702 m), weight 95.3 kg, SpO2 100 %.  Body mass index is 32.89 kg/m.   Treatment Plan Summary: Daily contact with patient to assess and evaluate symptoms and progress in treatment and Medication management   Patient is a 45 year old female with the above-stated past psychiatric history who is seen in follow-up.  Chart reviewed. Patient discussed with nursing. Patient remains acutely manic. She continues to be focused on discharge, express delusions and angry thoughts. Objectively, she remains agitated, tangential, with racing thoughts and pressured speech. She is less irritable and more cooperative today. No med changes today. The dose of Geodon was increased yesterday for acute mania; would consider addition of mood stabilizer.   Plan:  -continue inpatient psych admission; 15-minute checks; daily contact with patient to assess and evaluate symptoms and progress in treatment; psychoeducation.    -Scheduled Meds:  . propranolol  10 mg Oral BID  . topiramate  25 mg Oral BID  . ziprasidone  40 mg Oral BID WC   PRN Meds:.acetaminophen, alum & mag hydroxide-simeth, hydrOXYzine, magnesium hydroxide, temazepam, ziprasidone, ziprasidone  -Pertinent Labs: no new labs ordered today  -EKG order placed on admission, not done yet.     -Consults: No new consults placed since yesterday    -Disposition: Patient is not ready for discharge yet. All necessary aftercare will be arranged prior to discharge.   -  I certify that the patient does need, on a daily basis, active treatment furnished directly by or requiring the supervision of inpatient psychiatric facility personnel.     Thalia Party, MD 05/30/2020, 10:12 AM

## 2020-05-30 NOTE — Progress Notes (Signed)
Patient asked CSW to contact her Payee and have a conversation with her about getting her money back. CSW stated that would not be able to do that. Patient asked what she could do to get a different payee. CSW told patient that she would have to speak with social security. Patient stated she can't start anything because its the weekend. Patient also wanted to know when she would be discharged.

## 2020-05-30 NOTE — [facility] Group Notes (Signed)
[facility] Group Notes:  (Nursing/MHT/Case Management/Adjunct)  Date:  05/30/2020  Time:  8:40 PM  Type of Therapy:  Group Therapy  Participation Level:  Active  Participation Quality:  Appropriate  Affect:  Appropriate  Cognitive:  Alert  Insight:  Good  Engagement in Group:  Engaged and her goal was to talk to the Nurse.  Modes of Intervention:  Support  Summary of Progress/Problems:  [name] 05/30/2020, 8:40 PM

## 2020-05-30 NOTE — Plan of Care (Signed)
Patient has been in the milieu with no sign of distress. Anxious and restless but remains cooperative. Denies SI/HI/AVH. Received medications and had breakfast. Expressed no major concerns. Encouragements provided and safety maintained.

## 2020-05-30 NOTE — Progress Notes (Signed)
At 11:32am pt requested PRN medication for anxiety and agitation, Geodon and Vistaril PO were given, See MAR for those details.

## 2020-05-30 NOTE — [facility] Suicide Risk Assessment (Signed)
[facility] INPATIENT:  Family/Significant Other Suicide Prevention Education  Suicide Prevention Education:  Education Completed; Marybelle Killings, (Mother) has been identified by the patient as the family member/significant other with whom the patient will be residing, and identified as the person(s) who will aid the patient in the event of a mental health crisis (suicidal ideations/suicide attempt).  With written consent from the patient, the family member/significant other has been provided the following suicide prevention education, prior to the and/or following the discharge of the patient.  The suicide prevention education provided includes the following:  Suicide risk factors  Suicide prevention and interventions  National Suicide Hotline telephone number  [facility] assessment telephone number  [facility] Emergency Assistance 911  [facility] and/or Residential Mobile Crisis Unit telephone number  Request made of family/significant other to:  Remove weapons (e.g., guns, rifles, knives), all items previously/currently identified as safety concern.    Remove drugs/medications (over-the-counter, prescriptions, illicit drugs), all items previously/currently identified as a safety concern.  The family member/significant other verbalizes understanding of the suicide prevention education information provided.  The family member/significant other agrees to remove the items of safety concern listed above.  Carollee Herter  Denica Web 05/30/2020, 3:16 PM

## 2020-05-31 DIAGNOSIS — F3113 Bipolar disorder, current episode manic without psychotic features, severe: Secondary | ICD-10-CM | POA: Diagnosis not present

## 2020-05-31 MED ORDER — ZIPRASIDONE HCL 40 MG PO CAPS
60.0000 mg | ORAL_CAPSULE | Freq: Two times a day (BID) | ORAL | Status: DC
  Administered 2020-05-31 – 2020-06-03 (×6): 60 mg via ORAL
  Filled 2020-05-31 (×6): qty 1

## 2020-05-31 MED ORDER — OXCARBAZEPINE 150 MG PO TABS
150.0000 mg | ORAL_TABLET | Freq: Two times a day (BID) | ORAL | Status: DC
  Administered 2020-05-31 – 2020-06-03 (×6): 150 mg via ORAL
  Filled 2020-05-31 (×8): qty 1

## 2020-05-31 NOTE — Progress Notes (Signed)
Recreation Therapy Notes  INPATIENT RECREATION TR PLAN  Patient Details Name: Beth Berry MRN: [id number] DOB: 1975/07/10 Today's Date: 05/31/2020  Rec Therapy Plan Is patient appropriate for Therapeutic Recreation?: Yes Treatment times per week: at least 3 Estimated Length of Stay: 5-7 days TR Treatment/Interventions: Group participation (Comment)  Discharge Criteria Pt will be discharged from therapy if:: Discharged Treatment plan/goals/alternatives discussed and agreed upon by:: Patient/family  Discharge Summary     Belita Warsame 05/31/2020, 11:57 AM

## 2020-05-31 NOTE — Progress Notes (Signed)
Recreation Therapy Notes  INPATIENT RECREATION THERAPY ASSESSMENT  Patient Details Name: Beth Berry MRN: [id number] DOB: 06/22/1975 Today's Date: 05/31/2020       Information Obtained From: Patient  Able to Participate in Assessment/Interview: Yes  Patient Presentation: Responsive  Reason for Admission (Per Patient): Active Symptoms  Patient Stressors: Relationship,Family  Coping Skills:   Press photographer (Comment) (Work, Stress ball)  Leisure Interests (2+):  Music - Listen,Sports - Dance,Sports - Basketball,Sports - Football,Community - Travel (Comment) (Beach)  Frequency of Recreation/Participation: Monthly  Awareness of Community Resources:  Yes  Community Resources:  Park  Current Use: No  If no, Barriers?: Transportation,Financial  Expressed Interest in [employer] Information: Yes  [place]idence[place]  Patient Main Form of Transportation: Walk  Patient Strengths:  Caring about the well being others.  Patient Identified Areas of Improvement:  Have some self worth. Being okay with being alone.  Patient Goal for Hospitalization:  Take control of my own disability check.  Current SI (including self-harm):  No  Current HI:  No  Current AVH: No  Staff Intervention Plan: Group Attendance,Collaborate with Interdisciplinary Treatment Team  Consent to Intern Participation: N/A  Sayid Moll 05/31/2020, 11:56 AM

## 2020-05-31 NOTE — Plan of Care (Signed)
  Problem: Education: Goal: Emotional status will improve Outcome: Progressing Goal: Mental status will improve Outcome: Progressing Goal: Verbalization of understanding the information provided will improve Outcome: Progressing   Problem: Activity: Goal: Interest or engagement in activities will improve Outcome: Progressing   Problem: Coping: Goal: Ability to verbalize frustrations and anger appropriately will improve Outcome: Progressing Goal: Ability to demonstrate self-control will improve Outcome: Progressing   

## 2020-05-31 NOTE — [facility] Group Notes (Signed)
LCSW Group Therapy Note     05/31/2020 2:58 PM     Type of Therapy and Topic:  Group Therapy:  Overcoming Obstacles     Participation Level:  Active     Description of Group:     In this group patients will be encouraged to explore what they see as obstacles to their own wellness and recovery. They will be guided to discuss their thoughts, feelings, and behaviors related to these obstacles. The group will process together ways to cope with barriers, with attention given to specific choices patients can make. Each patient will be challenged to identify changes they are motivated to make in order to overcome their obstacles. This group will be process-oriented, with patients participating in exploration of their own experiences as well as giving and receiving support and challenge from other group members.     Therapeutic Goals:  1.    Patient will identify personal and current obstacles as they relate to admission.  2.    Patient will identify barriers that currently interfere with their wellness or overcoming obstacles.  3.    Patient will identify feelings, thought process and behaviors related to these barriers.  4.    Patient will identify two changes they are willing to make to overcome these obstacles:        Summary of Patient Progress: Pt discussed the obstacles of having "greedy people" in her life. 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      Therapeutic Modalities:    Cognitive Behavioral Therapy  Solution Focused Therapy  Motivational Interviewing  Relapse Prevention Therapy     Beth Berry, MSW, LCSW-A  05/31/2020 2:58 PM

## 2020-05-31 NOTE — Progress Notes (Signed)
[facility] MD Progress Note  05/31/2020 10:09 AM Beth Berry  MRN:  [id number]  Principal Problem: Bipolar affective disorder, manic, severe (HCC) Diagnosis: Principal Problem:   Bipolar affective disorder, manic, severe (HCC) Active Problems:   PTSD (post-traumatic stress disorder)   Cannabis abuse   Non compliance w medication regimen  CC "Can I be my own payee?"  Beth Berry is a 45 y.o. female patient with a history of bipolar disorder, who admitted to the [facility] unit for treatment of acute mania in settings of medication noncompliance.  Interval History Patient was seen today for re-evaluation.  Nursing reports no events overnight. The patient has no issues with performing ADLs.  Patient has been medication compliant.    Subjective:  Today patient remains restless with pressured and tangential speech. She continues to perseverate on needing a new payee, or being her own payee. She continues to lack any insight into her mental health diagnosis. She continues to ask for symptoms of bipolar disorder, however, interrupts at each start of explanation of symptoms. She has been medication compliant though, and she is less irritable and verbally aggressive than on exam this Friday. She continues to also endorse some paranoia about her payee trying to control her life, [name] trying to leave her, and dying here on the unit. She denies any suicidal ideations, homicidal ideations, visual hallucinations, and auditory hallucinations.   Of note, on collateral obtained from [name] on Friday, patient only expresses paranoia when acutely manic. She has not has instances of calling the police, fearing for her mothers life, or her own life when euthymic. Contacted [name] again today per patient request at [phone]: no answer, voicemail left with updates.   Labs: no new results for review.   Total Time spent with patient: 30 minutes  Past Psychiatric History: see H&P  Past Medical History:   Past Medical History:  Diagnosis Date  . Bipolar 1 disorder (HCC)   . Contusion of great toe of right foot 06/11/2011  . Drug abuse (HCC)   . Schizophrenia [facility])     Past Surgical History:  Procedure Laterality Date  . DENTAL SURGERY     Family History:  Family History  Problem Relation Age of Onset  . Hypertension Mother   . Drug abuse Mother   . Drug abuse Father    Family Psychiatric  History:  Social History:  Social History   Substance and Sexual Activity  Alcohol Use Yes   Comment: rarely     Social History   Substance and Sexual Activity  Drug Use Yes  . Types: Heroin, MDMA (Ecstacy), Cocaine   Comment: none now, hx of marijuana, narcotic, heroin, cocaine- last use 3 years    Social History   Socioeconomic History  . Marital status: Significant Other    Spouse name: Not on file  . Number of children: Not on file  . Years of education: Not on file  . Highest education level: Not on file  Occupational History  . Not on file  Tobacco Use  . Smoking status: Current Some Day Smoker    Packs/day: 1.50    Types: Cigarettes  . Smokeless tobacco: Never Used  Substance and Sexual Activity  . Alcohol use: Yes    Comment: rarely  . Drug use: Yes    Types: Heroin, MDMA (Ecstacy), Cocaine    Comment: none now, hx of marijuana, narcotic, heroin, cocaine- last use 3 years  . Sexual activity: Yes    Birth control/protection: None  Other Topics Concern  . Not on file  Social History Narrative  . Not on file   Social Determinants of Health   Financial Resource Strain: Not on file  Food Insecurity: Not on file  Transportation Needs: Not on file  Physical Activity: Not on file  Stress: Not on file  Social Connections: Not on file   Additional Social History:                         Sleep: Fair  Appetite:  Fair  Current Medications: Current Facility-Administered Medications  Medication Dose Route Frequency Provider Last Rate Last Admin  .  acetaminophen (TYLENOL) tablet 650 mg  650 mg Oral Q6H PRN Clapacs, Jackquline Denmark, MD   650 mg at 05/30/20 2106  . alum & mag hydroxide-simeth (MAALOX/MYLANTA) 200-200-20 MG/5ML suspension 30 mL  30 mL Oral Q4H PRN Clapacs, John T, MD      . hydrOXYzine (ATARAX/VISTARIL) tablet 50 mg  50 mg Oral TID PRN Clapacs, Jackquline Denmark, MD   50 mg at 05/31/20 0809  . magnesium hydroxide (MILK OF MAGNESIA) suspension 30 mL  30 mL Oral Daily PRN Clapacs, John T, MD      . propranolol (INDERAL) tablet 10 mg  10 mg Oral BID Jesse Sans, MD   10 mg at 05/31/20 0801  . temazepam (RESTORIL) capsule 15 mg  15 mg Oral QHS PRN Jesse Sans, MD   15 mg at 05/29/20 2106  . topiramate (TOPAMAX) tablet 25 mg  25 mg Oral BID Jesse Sans, MD   25 mg at 05/31/20 0801  . ziprasidone (GEODON) capsule 20 mg  20 mg Oral Q8H PRN Jesse Sans, MD   20 mg at 05/30/20 1132  . ziprasidone (GEODON) capsule 60 mg  60 mg Oral BID WC Jesse Sans, MD      . ziprasidone (GEODON) injection 20 mg  20 mg Intramuscular Q8H PRN Jesse Sans, MD        Lab Results:  No results found for this or any previous visit (from the past 48 hour(s)).  Blood Alcohol level:  Lab Results  Component Value Date   ETH <10 05/27/2020   ETH <10 04/06/2018    Metabolic Disorder Labs: Lab Results  Component Value Date   HGBA1C 5.2 05/28/2020   MPG 102.54 05/28/2020   MPG 116.89 04/08/2018   No results found for: PROLACTIN Lab Results  Component Value Date   CHOL 140 05/28/2020   TRIG 73 05/28/2020   HDL 40 (L) 05/28/2020   CHOLHDL 3.5 05/28/2020   VLDL 15 05/28/2020   LDLCALC 85 05/28/2020   LDLCALC 73 04/08/2018    Physical Findings: AIMS:  , ,  ,  ,    CIWA:    COWS:     Musculoskeletal: Strength & Muscle Tone: within normal limits Gait & Station: normal Patient leans: N/A  Psychiatric Specialty Exam:  Presentation  General Appearance: Fairly Groomed  Eye Contact:Fair  Speech:Pressured  Speech  Volume:Normal  Handedness:Right   Mood and Affect  Mood:Anxious  Affect:Congruent   Thought Process  Thought Processes:Disorganized  Descriptions of Associations:Tangential  Orientation:Full (Time, Place and Person)  Thought Content:Paranoid Ideation; Perseveration; Tangential  History of Schizophrenia/Schizoaffective disorder:No  Duration of Psychotic Symptoms:Greater than six months  Hallucinations:Hallucinations: None  Ideas of Reference:Paranoia; Percusatory  Suicidal Thoughts: denies today  Homicidal Thoughts:Homicidal Thoughts: No   Sensorium  Memory:Immediate Fair; Recent Fair; Remote Fair  Judgment:Poor  Insight:Lacking   Executive Functions  Concentration:Poor  Attention Span:Poor  Recall:Poor  Fund of Knowledge:Fair  Language:Fair   Psychomotor Activity  Psychomotor Activity:Psychomotor Activity: Restlessness   Assets  Assets:Desire for Improvement; Financial Resources/Insurance; Housing; Intimacy   Sleep  Sleep:Sleep: Fair Number of Hours of Sleep: 5    Physical Exam: Physical Exam  ROS  Blood pressure 134/62, pulse 96, temperature 97.8 F (36.6 C), temperature source Oral, resp. rate 18, height 5\' 7"  (1.702 m), weight 95.3 kg, SpO2 99 %. Body mass index is 32.89 kg/m.   Treatment Plan Summary: Daily contact with patient to assess and evaluate symptoms and progress in treatment and Medication management   Patient is a 45 year old female with the above-stated past psychiatric history who is seen in follow-up.  Chart reviewed. Patient discussed with nursing. Patient remains acutely manic. She continues to be focused on discharge, express delusions and angry thoughts. Objectively, she remains agitated, tangential, with racing thoughts and pressured speech. She is less irritable than last seen on Friday by this provider. Increase Geodon 60 mg BID, start Trileptal 150 mg BID.    Plan:  -continue inpatient psych admission;  15-minute checks; daily contact with patient to assess and evaluate symptoms and progress in treatment; psychoeducation.    -Scheduled Meds:  . propranolol  10 mg Oral BID  . topiramate  25 mg Oral BID  . ziprasidone  60 mg Oral BID WC   PRN Meds:.acetaminophen, alum & mag hydroxide-simeth, hydrOXYzine, magnesium hydroxide, temazepam, ziprasidone, ziprasidone  -Pertinent Labs: no new labs ordered today  -EKG order placed on admission, not done yet.    -Consults: No new consults placed since yesterday    -Disposition: Patient is not ready for discharge yet. All necessary aftercare will be arranged prior to discharge.   -  I certify that the patient does need, on a daily basis, active treatment furnished directly by or requiring the supervision of inpatient psychiatric facility personnel.   05/31/20: Psychiatric exam above reviewed and remains accurate. Assessment and plan above reviewed and updated.    07/31/20, MD 05/31/2020, 10:09 AM

## 2020-05-31 NOTE — Progress Notes (Signed)
Patient's thoughts are more disorganized today. Not reporting any SI or HI. Saying she cannot sleep but refusing sleep medication. Somewhat attention-seeking. Spent some time sitting on the floor outside of her room in the hallway

## 2020-05-31 NOTE — Progress Notes (Signed)
Collateral from [place]" Freida Busman [phone]: Reports that patient called her this morning, and remains very paranoid. She expressed continued concerns about the safety of her mother, and need for someone to call the police to check on her. She also remains fixated on her spending her money despite being her payee for many years.   Collateral from [place] [phone]: [name] also confirms that Micalah remains manic, paranoid, and far from her baseline.

## 2020-05-31 NOTE — Plan of Care (Signed)
  Problem: Education: Goal: Knowledge of [place] Education information/materials will improve Outcome: Progressing Goal: Emotional status will improve Outcome: Progressing Goal: Mental status will improve Outcome: Progressing Goal: Verbalization of understanding the information provided will improve Outcome: Progressing   Problem: Activity: Goal: Interest or engagement in activities will improve Outcome: Progressing Goal: Sleeping patterns will improve Outcome: Progressing   Problem: Coping: Goal: Ability to verbalize frustrations and anger appropriately will improve Outcome: Progressing Goal: Ability to demonstrate self-control will improve Outcome: Progressing   Problem: Health Behavior/Discharge Planning: Goal: Identification of resources available to assist in meeting health care needs will improve Outcome: Progressing Goal: Compliance with treatment plan for underlying cause of condition will improve Outcome: Progressing   Problem: Physical Regulation: Goal: Ability to maintain clinical measurements within normal limits will improve Outcome: Progressing   Problem: Safety: Goal: Periods of time without injury will increase Outcome: Progressing   Problem: Education: Goal: Utilization of techniques to improve thought processes will improve Outcome: Progressing Goal: Knowledge of the prescribed therapeutic regimen will improve Outcome: Progressing   Problem: Activity: Goal: Interest or engagement in leisure activities will improve Outcome: Progressing Goal: Imbalance in normal sleep/wake cycle will improve Outcome: Progressing   Problem: Coping: Goal: Coping ability will improve Outcome: Progressing Goal: Will verbalize feelings Outcome: Progressing   Problem: Health Behavior/Discharge Planning: Goal: Ability to make decisions will improve Outcome: Progressing Goal: Compliance with therapeutic regimen will improve Outcome: Progressing    Problem: Role Relationship: Goal: Will demonstrate positive changes in social behaviors and relationships Outcome: Progressing   Problem: Safety: Goal: Ability to disclose and discuss suicidal ideas will improve Outcome: Progressing Goal: Ability to identify and utilize support systems that promote safety will improve Outcome: Progressing   Problem: Self-Concept: Goal: Will verbalize positive feelings about self Outcome: Progressing Goal: Level of anxiety will decrease Outcome: Progressing   Problem: Education: Goal: Ability to state activities that reduce stress will improve Outcome: Progressing   Problem: Coping: Goal: Ability to identify and develop effective coping behavior will improve Outcome: Progressing   Problem: Self-Concept: Goal: Ability to identify factors that promote anxiety will improve Outcome: Progressing Goal: Level of anxiety will decrease Outcome: Progressing Goal: Ability to modify response to factors that promote anxiety will improve Outcome: Progressing   

## 2020-05-31 NOTE — Progress Notes (Signed)
Recreation Therapy Notes  Date: 05/31/2020  Time: 9:30 am  Location: Craft room   Behavioral response: Appropriate   Intervention Topic: Stress Management   Discussion/Intervention:  Group content on today was focused on stress. The group defined stress and way to cope with stress. Participants expressed how they know when they are stresses out. Individuals described the different ways they have to cope with stress. The group stated reasons why it is important to cope with stress. Patient explained what good stress is and some examples. The group participated in the intervention "Stress Management". Individuals were able to brainstorm new ways to manage their stress.  Clinical Observations/Feedback:  Patient came to group late due to unknown reasons. [name].   Iren Whipp LRT/CTRS         Vinie Charity 05/31/2020 11:29 AM

## 2020-05-31 NOTE — Progress Notes (Signed)
Patient was cooperative with assessment this morning. She denies SI, HI, and AVH. Patient's thoughts seem more organized this morning compared to previous encounters with patient, but she is preoccupied with a situation with a payee. She is endorsing anxiety, Vistaril PRN was given. Patient questioned her medications, stating "I admit, I didn't take that purple pill one time because I thought it was to knock me out". Patient also questioned why the propanolol pill was cut in half. Patient was educated on her medications and dosages and eventually swallowed her medications. Support and encouragement was provided to patient.   Patient remains safe on the unit at this time and q15 min safety checks are maintained.

## 2020-06-01 DIAGNOSIS — F3113 Bipolar disorder, current episode manic without psychotic features, severe: Secondary | ICD-10-CM | POA: Diagnosis not present

## 2020-06-01 NOTE — Progress Notes (Signed)
Recreation Therapy Notes   Date: 06/01/2020  Time: 2:30pm  Location: Courtyard   Behavioral response: Appropriate  Group Type: Leisure  Participation level: Active  Communication: Patient was social with peers and staff.  Comments: N/A  Yaslene Lindamood LRT/CTRS        Mirinda Monte 06/01/2020 3:49 PM

## 2020-06-01 NOTE — Plan of Care (Signed)
Pt denies anxiety, depression, AVH, SI and HI. Pt was educated on care plan and verbalizes understanding. Pt is cooperative, somewhat guarded and paranoid. Pt was encouraged to attend groups. Torrie Mayers RN Problem: Education: Goal: Knowledge of [place] [facility] Education information/materials will improve Outcome: Progressing Goal: Emotional status will improve Outcome: Progressing Goal: Mental status will improve Outcome: Progressing Goal: Verbalization of understanding the information provided will improve Outcome: Progressing   Problem: Activity: Goal: Interest or engagement in activities will improve Outcome: Progressing Goal: Sleeping patterns will improve Outcome: Progressing   Problem: Coping: Goal: Ability to verbalize frustrations and anger appropriately will improve Outcome: Progressing Goal: Ability to demonstrate self-control will improve Outcome: Progressing   Problem: Health Behavior/Discharge Planning: Goal: Identification of resources available to assist in meeting health care needs will improve Outcome: Progressing Goal: Compliance with treatment plan for underlying cause of condition will improve Outcome: Progressing   Problem: Physical Regulation: Goal: Ability to maintain clinical measurements within normal limits will improve Outcome: Progressing   Problem: Safety: Goal: Periods of time without injury will increase Outcome: Progressing   Problem: Education: Goal: Utilization of techniques to improve thought processes will improve Outcome: Progressing Goal: Knowledge of the prescribed therapeutic regimen will improve Outcome: Progressing   Problem: Activity: Goal: Interest or engagement in leisure activities will improve Outcome: Progressing Goal: Imbalance in normal sleep/wake cycle will improve Outcome: Progressing   Problem: Coping: Goal: Coping ability will improve Outcome: Progressing Goal: Will verbalize feelings Outcome:  Progressing   Problem: Health Behavior/Discharge Planning: Goal: Ability to make decisions will improve Outcome: Progressing Goal: Compliance with therapeutic regimen will improve Outcome: Progressing   Problem: Role Relationship: Goal: Will demonstrate positive changes in social behaviors and relationships Outcome: Progressing   Problem: Safety: Goal: Ability to disclose and discuss suicidal ideas will improve Outcome: Progressing Goal: Ability to identify and utilize support systems that promote safety will improve Outcome: Progressing   Problem: Self-Concept: Goal: Will verbalize positive feelings about self Outcome: Progressing Goal: Level of anxiety will decrease Outcome: Progressing   Problem: Education: Goal: Ability to state activities that reduce stress will improve Outcome: Progressing   Problem: Coping: Goal: Ability to identify and develop effective coping behavior will improve Outcome: Progressing   Problem: Self-Concept: Goal: Ability to identify factors that promote anxiety will improve Outcome: Progressing Goal: Level of anxiety will decrease Outcome: Progressing Goal: Ability to modify response to factors that promote anxiety will improve Outcome: Progressing

## 2020-06-01 NOTE — Progress Notes (Signed)
[facility] MD Progress Note  06/01/2020 1:37 PM Beth Berry  MRN:  [id number]  Principal Problem: Bipolar affective disorder, manic, severe (HCC) Diagnosis: Principal Problem:   Bipolar affective disorder, manic, severe (HCC) Active Problems:   PTSD (post-traumatic stress disorder)   Cannabis abuse   Non compliance w medication regimen  CC "Can I leave today for [place]?"  Beth Berry is a 45 y.o. female patient with a history of bipolar disorder, who admitted to the [facility] unit for treatment of acute mania in settings of medication noncompliance.  Interval History Patient was seen today for re-evaluation.  Nursing reports no events overnight. The patient has no issues with performing ADLs.  Patient has been medication compliant.    Subjective:  Patient seen this morning and she appears fairly anxious. Speech is slightly less pressured, but remains tangential. She remains paranoid that her half sister/payee is spending all of her money. 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 She does not seem to grasp the inadequacy of funds for her current plan. She does admit that she had trama in her past, and is able to identify her symptoms of PTSD today. However, her insight into bipolar illness continues to be lacking. She continues to be resistant to hearing the symptoms she is displaying from myself or her family members. She denies any homicidal ideations, visual hallucinations, suicidal ideations, or auditory hallucinations.   Spoke with her mother, Beth Berry, [phone]. She notes that when patient becomes manic she tends to become paranoid and fixated on getting a new payee and moving back to [place]. Mother notes that she has no family or contacts in [place] to stay with. 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 Beth Berry was  able to speak to Beth Berry yesterday, and continues to feel she is manic, and unable to care for herself at this time. She states that when stable she does not wish to leave the state, and she no longer asks to have a different payee. Paranoia also resolves when mania resolves.    Labs: no new results for review.   Total Time spent with patient: 30 minutes  Past Psychiatric History: see H&P  Past Medical History:  Past Medical History:  Diagnosis Date  . Bipolar 1 disorder (HCC)   . Contusion of great toe of right foot 06/11/2011  . Drug abuse (HCC)   . Schizophrenia [facility])     Past Surgical History:  Procedure Laterality Date  . DENTAL SURGERY     Family History:  Family History  Problem Relation Age of Onset  . Hypertension Mother   . Drug abuse Mother   . Drug abuse Father    Family Psychiatric  History:  Social History:  Social History   Substance and Sexual Activity  Alcohol Use Yes   Comment: rarely     Social History   Substance and Sexual Activity  Drug Use Yes  . Types: Heroin, MDMA (Ecstacy), Cocaine   Comment: none now, hx of marijuana, narcotic, heroin, cocaine- last use 3 years    Social History   Socioeconomic History  . Marital status: Significant Other    Spouse name: Not on file  . Number of children: Not on file  . Years of education: Not on file  . Highest education level:  Not on file  Occupational History  . Not on file  Tobacco Use  . Smoking status: Current Some Day Smoker    Packs/day: 1.50    Types: Cigarettes  . Smokeless tobacco: Never Used  Substance and Sexual Activity  . Alcohol use: Yes    Comment: rarely  . Drug use: Yes    Types: Heroin, MDMA (Ecstacy), Cocaine    Comment: none now, hx of marijuana, narcotic, heroin, cocaine- last use 3 years  . Sexual activity: Yes    Birth control/protection: None  Other Topics Concern  . Not on file  Social History Narrative  . Not on file   Social Determinants of Health    Financial Resource Strain: Not on file  Food Insecurity: Not on file  Transportation Needs: Not on file  Physical Activity: Not on file  Stress: Not on file  Social Connections: Not on file   Additional Social History:     Sleep: Fair  Appetite:  Fair  Current Medications: Current Facility-Administered Medications  Medication Dose Route Frequency Provider Last Rate Last Admin  . acetaminophen (TYLENOL) tablet 650 mg  650 mg Oral Q6H PRN Clapacs, Jackquline Denmark, MD   650 mg at 05/30/20 2106  . alum & mag hydroxide-simeth (MAALOX/MYLANTA) 200-200-20 MG/5ML suspension 30 mL  30 mL Oral Q4H PRN Clapacs, John T, MD      . hydrOXYzine (ATARAX/VISTARIL) tablet 50 mg  50 mg Oral TID PRN Clapacs, Jackquline Denmark, MD   50 mg at 06/01/20 0755  . magnesium hydroxide (MILK OF MAGNESIA) suspension 30 mL  30 mL Oral Daily PRN Clapacs, John T, MD      . OXcarbazepine (TRILEPTAL) tablet 150 mg  150 mg Oral BID Jesse Sans, MD   150 mg at 06/01/20 0830  . propranolol (INDERAL) tablet 10 mg  10 mg Oral BID Jesse Sans, MD   10 mg at 06/01/20 0751  . temazepam (RESTORIL) capsule 15 mg  15 mg Oral QHS PRN Jesse Sans, MD   15 mg at 05/31/20 2050  . topiramate (TOPAMAX) tablet 25 mg  25 mg Oral BID Jesse Sans, MD   25 mg at 06/01/20 0751  . ziprasidone (GEODON) capsule 20 mg  20 mg Oral Q8H PRN Jesse Sans, MD   20 mg at 05/30/20 1132  . ziprasidone (GEODON) capsule 60 mg  60 mg Oral BID WC Jesse Sans, MD   60 mg at 06/01/20 0753  . ziprasidone (GEODON) injection 20 mg  20 mg Intramuscular Q8H PRN Jesse Sans, MD        Lab Results:  No results found for this or any previous visit (from the past 48 hour(s)).  Blood Alcohol level:  Lab Results  Component Value Date   ETH <10 05/27/2020   ETH <10 04/06/2018    Metabolic Disorder Labs: Lab Results  Component Value Date   HGBA1C 5.2 05/28/2020   MPG 102.54 05/28/2020   MPG 116.89 04/08/2018   No results found for:  PROLACTIN Lab Results  Component Value Date   CHOL 140 05/28/2020   TRIG 73 05/28/2020   HDL 40 (L) 05/28/2020   CHOLHDL 3.5 05/28/2020   VLDL 15 05/28/2020   LDLCALC 85 05/28/2020   LDLCALC 73 04/08/2018    Physical Findings: AIMS:  , ,  ,  ,    CIWA:    COWS:     Musculoskeletal: Strength & Muscle Tone: within normal limits Gait & Station:  normal Patient leans: N/A  Psychiatric Specialty Exam:  Presentation  General Appearance: Fairly Groomed  Eye Contact:Fair  Speech:Pressured  Speech Volume:Normal  Handedness:Right   Mood and Affect  Mood:Anxious  Affect:Congruent   Thought Process  Thought Processes:Disorganized  Descriptions of Associations:Tangential  Orientation:Full (Time, Place and Person)  Thought Content:Paranoid Ideation; Perseveration; Tangential  History of Schizophrenia/Schizoaffective disorder:No  Duration of Psychotic Symptoms:Greater than six months  Hallucinations:Hallucinations: None  Ideas of Reference:Paranoia; Percusatory  Suicidal Thoughts: denies today  Homicidal Thoughts:Homicidal Thoughts: No   Sensorium  Memory:Immediate Fair; Recent Fair; Remote Fair  Judgment:Poor  Insight:Lacking   Executive Functions  Concentration:Poor  Attention Span:Poor  Recall:Poor  Fund of Knowledge:Fair  Language:Fair   Psychomotor Activity  Psychomotor Activity:Psychomotor Activity: Restlessness   Assets  Assets:Desire for Improvement; Financial Resources/Insurance; Housing; Intimacy   Sleep  Sleep: Improved, 8 hours   Physical Exam: Physical Exam  ROS  Blood pressure 117/74, pulse 93, temperature 97.7 F (36.5 C), temperature source Oral, resp. rate 18, height 5\' 7"  (1.702 m), weight 95.3 kg, SpO2 100 %. Body mass index is 32.89 kg/m.   Treatment Plan Summary: Daily contact with patient to assess and evaluate symptoms and progress in treatment and Medication management   Patient is a 45 year old  female with the above-stated past psychiatric history who is seen in follow-up.  Chart reviewed. Patient discussed with nursing. Patient remains acutely manic, but somewhat improved than on admission. She continues to be focused on discharge, expresses delusions and angry thoughts toward her sister. Objectively, she remains agitated, tangential, paranoid, and with pressured speech. Continue Geodon 60 mg BID (increased yesterday), continue Trileptal 150 mg BID (started yesterday).    Plan:  -continue inpatient psych admission; 15-minute checks; daily contact with patient to assess and evaluate symptoms and progress in treatment; psychoeducation.    -Scheduled Meds:  . OXcarbazepine  150 mg Oral BID  . propranolol  10 mg Oral BID  . topiramate  25 mg Oral BID  . ziprasidone  60 mg Oral BID WC   PRN Meds:.acetaminophen, alum & mag hydroxide-simeth, hydrOXYzine, magnesium hydroxide, temazepam, ziprasidone, ziprasidone  -Pertinent Labs: no new labs ordered today  -EKG order placed on admission, not done yet.    -Consults: No new consults placed since yesterday    -Disposition: Patient is not ready for discharge yet. All necessary aftercare will be arranged prior to discharge.   -  I certify that the patient does need, on a daily basis, active treatment furnished directly by or requiring the supervision of inpatient psychiatric facility personnel.   06/01/20: Psychiatric exam above reviewed and remains accurate. Assessment and plan above reviewed and updated.     08/01/20, MD 06/01/2020, 1:37 PM

## 2020-06-01 NOTE — Plan of Care (Signed)
Patient presents with less bizarre affect than when this writer saw her on the unit last week   Problem: Education: Goal: Emotional status will improve Outcome: Progressing Goal: Mental status will improve Outcome: Progressing

## 2020-06-01 NOTE — Progress Notes (Signed)
Patient calm and compliant during assessment denying SI/HI/AVH. Patient endorses anxiety. Pt observed interacting appropriately with staff and peers on the unit. Pt given education, support, and encouragement to be active in her treatment plan. Pt compliant with medication administration per MD orders. Pt being monitored Q 15 minutes for safety per unit protocol. Pt remains safe on the unit.  

## 2020-06-01 NOTE — Progress Notes (Signed)
Pt rates anxiety 2/10. Pt denies depression, SI, HI and AVH. Pt was educated on care plan and verbalizes understanding. Pt is pleasant, assertive , calm and cooperative. Pt attended all groups and went outdoors. Torrie Mayers RN

## 2020-06-01 NOTE — Progress Notes (Signed)
Recreation Therapy Notes  Date: 06/01/2020  Time: 9:30 am  Location: Craft room   Behavioral response: Appropriate   Intervention Topic: Goals   Discussion/Intervention:  Group content on today was focused on goals. Patients described what goals are and how they define goals. Individuals expressed how they go about setting goals and reaching them. The group identified how important goals are and if they make short term goals to reach long term goals. Patients described how many goals they work on at a time and what affects them not reaching their goal. Individuals described how much time they put into planning and obtaining their goals. The group participated in the intervention "My Goal Board" and made personal goal boards to help them achieve their goal. Clinical Observations/Feedback:  Patient came to group and was focused on what peers and staff had to say about goals. [name]. She left group early due to being uncomfortable with a peer's inappropriate questioning.   Kyrese Gartman LRT/CTRS            Agustine Rossitto 06/01/2020 11:35 AM
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
## 2020-06-01 NOTE — [facility] Group Notes (Signed)
LCSW Group Therapy Note  06/01/2020 3:05 PM  Type of Therapy/Topic:  Group Therapy:  Feelings about Diagnosis  Participation Level:  Active   Description of Group:   This group will allow patients to explore their thoughts and feelings about diagnoses they have received. Patients will be guided to explore their level of understanding and acceptance of these diagnoses. Facilitator will encourage patients to process their thoughts and feelings about the reactions of others to their diagnosis and will guide patients in identifying ways to discuss their diagnosis with significant others in their lives. This group will be process-oriented, with patients participating in exploration of their own experiences, giving and receiving support, and processing challenge from other group members.   Therapeutic Goals: 1. Patient will demonstrate understanding of diagnosis as evidenced by identifying two or more symptoms of the disorder 2. Patient will be able to express two feelings regarding the diagnosis 3. Patient will demonstrate their ability to communicate their needs through discussion and/or role play  Summary of Patient Progress: Patient was present for the majority of the group. She was focused on the negative aspects of having a mental health diagnosis but was able to provide her peers with pertinent feedback. Patient was able to speak to her feelings around her diagnosis, mainly feeling frustrated about not being able to do things for herself due to her payee and fearing that they were trying to take over guardianship of her.   Therapeutic Modalities:   Cognitive Behavioral Therapy Brief Therapy Feelings Identification   Karter Haire R. Algis Greenhouse, MSW, LCSW, LCAS 06/01/2020 3:05 PM

## 2020-06-01 NOTE — Plan of Care (Signed)
  Problem: Group Participation Goal: STG - Patient will engage in groups without prompting or encouragement from LRT x3 group sessions within 5 recreation therapy group sessions Description: STG - Patient will engage in groups without prompting or encouragement from LRT x3 group sessions within 5 recreation therapy group sessions Outcome: Progressing   Problem: Decision Making Goal: STG - Patient will successfully identify 2 ways of making healthy decisions post d/c within 5 recreation therapy group sessions Description: STG - Patient will successfully identify 2 ways of making healthy decisions post d/c within 5 recreation therapy group sessions Outcome: Progressing

## 2020-06-02 DIAGNOSIS — F3113 Bipolar disorder, current episode manic without psychotic features, severe: Secondary | ICD-10-CM | POA: Diagnosis not present

## 2020-06-02 NOTE — Progress Notes (Signed)
Recreation Therapy Notes  Date: 06/02/2020  Time: 9:30 am  Location: Craft room   Behavioral response: Appropriate   Intervention Topic: Communication  Discussion/Intervention:  Group content today was focused on communication. The group defined communication and ways to communicate with others. Individuals stated reason why communication is important and some reasons to communicate with others. Patients expressed if they thought they were good at communicating with others and ways they could improve their communication skills. The group identified important parts of communication and some experiences they have had in the past with communication. The group participated in the intervention "What is that?", where they had a chance to test out their communication skills and identify ways to improve their communication techniques.  Clinical Observations/Feedback:  Patient came to group expressed that communication is important because people can develop their own assumptions. He stated that communication is important so people know what you need. [name].   Mckena Chern LRT/CTRS         Ginni Eichler 06/02/2020 12:16 PM

## 2020-06-02 NOTE — Progress Notes (Signed)
[facility] MD Progress Note  06/02/2020 1:07 PM Beth Berry  MRN:  [id number]  Principal Problem: Bipolar affective disorder, manic, severe (HCC) Diagnosis: Principal Problem:   Bipolar affective disorder, manic, severe (HCC) Active Problems:   PTSD (post-traumatic stress disorder)   Cannabis abuse   Non compliance w medication regimen  CC "I'm doing okay"  Beth Berry is a 45 y.o. female patient with a history of bipolar disorder, who admitted to the [facility] unit for treatment of acute mania in settings of medication noncompliance.  Interval History Patient was seen today for re-evaluation.  Nursing reports no events overnight. The patient has no issues with performing ADLs.  Patient has been medication compliant.    Subjective:  Patient seen this morning, and she is calm on assessment. She occasionally has moments of being circumstantial on exam, but speech is a normal rate and volume today. She no longer expresses paranoia about her payee, nor does she express any desire to go to [place] today. She is more focused on exam, and desire to find a boarding room or inexpensive apartment to stay in around this area. 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 She plans to go get her belongings from her home, stay at a hotel that night, and then move into a new place on Friday.   Contacted [place], [phone]- She has been in contact with [facility]. She notes improvement in mood since arrival. She confirms that she feel safe picking Adonai up tomorrow, and bringing her to the home to gather her belongings. Plan is to stay in hotel that night as her mother still does not feel comfortable with Valerye spending the night. She had no other questions or concerns.   Spoke with Jamesetta So "Annice Pih" Freida Busman [phone] who confirms she has  found a place to rent that is available starting this Friday. She also confirms that she has the funding to afford this place. She is aware of plans to return to [employer] tomorrow to gather belongings so she can move in on Friday. Jamesetta So also feels she is getting back to her baseline. Updated on current medication regimen.    Labs: no new results for review.   Total Time spent with patient: 30 minutes  Past Psychiatric History: see H&P  Past Medical History:  Past Medical History:  Diagnosis Date  . Bipolar 1 disorder (HCC)   . Contusion of great toe of right foot 06/11/2011  . Drug abuse (HCC)   . Schizophrenia [facility])     Past Surgical History:  Procedure Laterality Date  . DENTAL SURGERY     Family History:  Family History  Problem Relation Age of Onset  . Hypertension Mother   . Drug abuse Mother   . Drug abuse Father    Family Psychiatric  History:  Social History:  Social History   Substance and Sexual Activity  Alcohol Use Yes   Comment: rarely     Social History   Substance and Sexual Activity  Drug Use Yes  . Types: Heroin, MDMA (Ecstacy), Cocaine   Comment: none now, hx of marijuana, narcotic, heroin, cocaine- last use 3 years    Social History   Socioeconomic History  . Marital status: Significant Other    Spouse name: Not on file  . Number of children: Not on file  .  Years of education: Not on file  . Highest education level: Not on file  Occupational History  . Not on file  Tobacco Use  . Smoking status: Current Some Day Smoker    Packs/day: 1.50    Types: Cigarettes  . Smokeless tobacco: Never Used  Substance and Sexual Activity  . Alcohol use: Yes    Comment: rarely  . Drug use: Yes    Types: Heroin, MDMA (Ecstacy), Cocaine    Comment: none now, hx of marijuana, narcotic, heroin, cocaine- last use 3 years  . Sexual activity: Yes    Birth control/protection: None  Other Topics Concern  . Not on file  Social History Narrative  . Not  on file   Social Determinants of Health   Financial Resource Strain: Not on file  Food Insecurity: Not on file  Transportation Needs: Not on file  Physical Activity: Not on file  Stress: Not on file  Social Connections: Not on file   Additional Social History:     Sleep: Fair  Appetite:  Fair  Current Medications: Current Facility-Administered Medications  Medication Dose Route Frequency Provider Last Rate Last Admin  . acetaminophen (TYLENOL) tablet 650 mg  650 mg Oral Q6H PRN Clapacs, Jackquline Denmark, MD   650 mg at 05/30/20 2106  . alum & mag hydroxide-simeth (MAALOX/MYLANTA) 200-200-20 MG/5ML suspension 30 mL  30 mL Oral Q4H PRN Clapacs, John T, MD      . hydrOXYzine (ATARAX/VISTARIL) tablet 50 mg  50 mg Oral TID PRN Clapacs, Jackquline Denmark, MD   50 mg at 06/01/20 2144  . magnesium hydroxide (MILK OF MAGNESIA) suspension 30 mL  30 mL Oral Daily PRN Clapacs, John T, MD      . OXcarbazepine (TRILEPTAL) tablet 150 mg  150 mg Oral BID Jesse Sans, MD   150 mg at 06/02/20 0805  . propranolol (INDERAL) tablet 10 mg  10 mg Oral BID Jesse Sans, MD   10 mg at 06/02/20 0805  . temazepam (RESTORIL) capsule 15 mg  15 mg Oral QHS PRN Jesse Sans, MD   15 mg at 06/01/20 2144  . topiramate (TOPAMAX) tablet 25 mg  25 mg Oral BID Jesse Sans, MD   25 mg at 06/02/20 0805  . ziprasidone (GEODON) capsule 20 mg  20 mg Oral Q8H PRN Jesse Sans, MD   20 mg at 05/30/20 1132  . ziprasidone (GEODON) capsule 60 mg  60 mg Oral BID WC Jesse Sans, MD   60 mg at 06/02/20 0805  . ziprasidone (GEODON) injection 20 mg  20 mg Intramuscular Q8H PRN Jesse Sans, MD        Lab Results:  No results found for this or any previous visit (from the past 48 hour(s)).  Blood Alcohol level:  Lab Results  Component Value Date   ETH <10 05/27/2020   ETH <10 04/06/2018    Metabolic Disorder Labs: Lab Results  Component Value Date   HGBA1C 5.2 05/28/2020   MPG 102.54 05/28/2020   MPG 116.89  04/08/2018   No results found for: PROLACTIN Lab Results  Component Value Date   CHOL 140 05/28/2020   TRIG 73 05/28/2020   HDL 40 (L) 05/28/2020   CHOLHDL 3.5 05/28/2020   VLDL 15 05/28/2020   LDLCALC 85 05/28/2020   LDLCALC 73 04/08/2018    Physical Findings: AIMS:  , ,  ,  ,    CIWA:    COWS:  Musculoskeletal: Strength & Muscle Tone: within normal limits Gait & Station: normal Patient leans: N/A  Psychiatric Specialty Exam:  Presentation  General Appearance: Fairly Groomed  Eye Contact:Fair  Speech:Normal rate Speech Volume:Normal  Handedness:Right   Mood and Affect  Mood:Euthymic Affect:Congruent   Thought Process  Thought Processes:Logical Descriptions of Associations:Mostly linear with periods of being circumstantial  Orientation:Full (Time, Place and Person)  Thought Content: Logical  History of Schizophrenia/Schizoaffective disorder:No  Duration of Psychotic Symptoms:Greater than six months  Hallucinations:No data recorded  Ideas of Reference:None Suicidal Thoughts: denies today  Homicidal Thoughts:Denies  Sensorium  Memory:Immediate Fair; Recent Fair; Remote Fair  Judgment:Intact Insight:Shallow  Executive Functions  Concentration:Fair Attention Span:Fair Recall:Fair Fund of Knowledge:Fair  Language:Fair   Psychomotor Activity  Psychomotor Activity:Normal  Assets  Assets:Desire for Improvement; Financial Resources/Insurance; Housing; Intimacy   Sleep  Sleep: Good, 8 hours   Physical Exam: Physical Exam  ROS  Blood pressure 117/85, pulse 92, temperature 98.6 F (37 C), temperature source Oral, resp. rate 18, height 5\' 7"  (1.702 m), weight 95.3 kg, SpO2 98 %. Body mass index is 32.89 kg/m.   Treatment Plan Summary: Daily contact with patient to assess and evaluate symptoms and progress in treatment and Medication management   Patient is a 45 year old female with the above-stated past psychiatric history who is  seen in follow-up.  Chart reviewed. Patient discussed with nursing. Patient is much less manic. Speech normal rate, and significantly more linear. No longer endorsing paranoia. Future plans now more realistic.  Continue Geodon 60 mg BID,continue Trileptal 150 mg BID    Plan:  -continue inpatient psych admission; 15-minute checks; daily contact with patient to assess and evaluate symptoms and progress in treatment; psychoeducation.    -Scheduled Meds:  . OXcarbazepine  150 mg Oral BID  . propranolol  10 mg Oral BID  . topiramate  25 mg Oral BID  . ziprasidone  60 mg Oral BID WC   PRN Meds:.acetaminophen, alum & mag hydroxide-simeth, hydrOXYzine, magnesium hydroxide, temazepam, ziprasidone, ziprasidone  -Pertinent Labs: no new labs ordered today  -EKG order placed on admission, not done yet.    -Consults: No new consults placed since yesterday    -Disposition: Patient is not ready for discharge yet. All necessary aftercare will be arranged prior to discharge.   -  I certify that the patient does need, on a daily basis, active treatment furnished directly by or requiring the supervision of inpatient psychiatric facility personnel.   06/02/20 :Psychiatric exam above reviewed and remains accurate. Assessment and plan above reviewed and updated.     08/02/20, MD 06/02/2020, 1:07 PM

## 2020-06-02 NOTE — [facility] Group Notes (Signed)
LCSW Group Therapy Note  06/02/2020 2:44 PM  Type of Therapy/Topic:  Group Therapy:  Emotion Regulation  Participation Level:  Active   Description of Group:   The purpose of this group is to assist patients in learning to regulate negative emotions and experience positive emotions. Patients will be guided to discuss ways in which they have been vulnerable to their negative emotions. These vulnerabilities will be juxtaposed with experiences of positive emotions or situations, and patients will be challenged to use positive emotions to combat negative ones. Special emphasis will be placed on coping with negative emotions in conflict situations, and patients will process healthy conflict resolution skills.  Therapeutic Goals: 1. Patient will identify two positive emotions or experiences to reflect on in order to balance out negative emotions 2. Patient will label two or more emotions that they find the most difficult to experience 3. Patient will demonstrate positive conflict resolution skills through discussion and/or role plays  Summary of Patient Progress: Patient was present for the entirety of the group session. Patient was an active listener and participated in the topic of discussion, provided helpful advice to others, and added nuance to topic of conversation. Patient initially withdrew from conversation after confrontation with other participant. Other participant left and patient began to participate more.     Therapeutic Modalities:   Cognitive Behavioral Therapy Feelings Identification Dialectical Behavioral Therapy  Gwenevere Ghazi, MSW, [place], Bridget Hartshorn 06/02/2020 2:44 PM

## 2020-06-02 NOTE — Progress Notes (Signed)
Patient is animated and cooperative with assessment. She denies SI, HI, and AVH.  Patient speech is logical/coherent. She states her only concerns are some "personal problems" but nothing to do with her health. Patient appears less paranoid about her medications and was able to state what she was taking without questioning pill shapes/colors. Patient is active on the unit and is on the phone for most of the morning.  Patient remains safe on the unit at this time and q15 min safety checks are maintained. Support and encouragement was provided.

## 2020-06-02 NOTE — Progress Notes (Signed)
Patient calm and compliant during assessment denying SI/HI/AVH. Patient endorses anxiety. Pt observed interacting appropriately with staff and peers on the unit. Pt given education, support, and encouragement to be active in her treatment plan. Pt compliant with medication administration per MD orders. Pt being monitored Q 15 minutes for safety per unit protocol. Pt remains safe on the unit.

## 2020-06-02 NOTE — Plan of Care (Signed)
Patient states she feels ready to leave tomorrow   Problem: Education: Goal: Emotional status will improve Outcome: Progressing Goal: Mental status will improve Outcome: Progressing

## 2020-06-03 DIAGNOSIS — F3113 Bipolar disorder, current episode manic without psychotic features, severe: Secondary | ICD-10-CM | POA: Diagnosis not present

## 2020-06-03 MED ORDER — TOPIRAMATE 25 MG PO TABS: 25.0000 mg | ORAL_TABLET | Freq: Two times a day (BID) | ORAL | 1 refills | Status: AC

## 2020-06-03 MED ORDER — TEMAZEPAM 15 MG PO CAPS: 15.0000 mg | ORAL_CAPSULE | Freq: Every evening | ORAL | 0 refills | Status: AC | PRN

## 2020-06-03 MED ORDER — OXCARBAZEPINE 150 MG PO TABS: 150.0000 mg | ORAL_TABLET | Freq: Two times a day (BID) | ORAL | 1 refills | Status: AC

## 2020-06-03 MED ORDER — HYDROXYZINE HCL 50 MG PO TABS: 50.0000 mg | ORAL_TABLET | Freq: Three times a day (TID) | ORAL | 1 refills | Status: AC | PRN

## 2020-06-03 MED ORDER — ZIPRASIDONE HCL 60 MG PO CAPS: 60.0000 mg | ORAL_CAPSULE | Freq: Two times a day (BID) | ORAL | 1 refills | Status: AC

## 2020-06-03 MED ORDER — PROPRANOLOL HCL 10 MG PO TABS: 10.0000 mg | ORAL_TABLET | Freq: Two times a day (BID) | ORAL | 1 refills | Status: AC

## 2020-06-03 NOTE — Plan of Care (Signed)
  Problem: Group Participation Goal: STG - Patient will engage in groups without prompting or encouragement from LRT x3 group sessions within 5 recreation therapy group sessions Description: STG - Patient will engage in groups without prompting or encouragement from LRT x3 group sessions within 5 recreation therapy group sessions Outcome: Completed/Met   Problem: Decision Making Goal: STG - Patient will successfully identify 2 ways of making healthy decisions post d/c within 5 recreation therapy group sessions Description: STG - Patient will successfully identify 2 ways of making healthy decisions post d/c within 5 recreation therapy group sessions Outcome: Completed/Met

## 2020-06-03 NOTE — Tx Team (Signed)
Interdisciplinary Treatment and Diagnostic Plan Update  06/03/2020 Time of Session: 08:30AM Beth Berry MRN: [id number]  Principal Diagnosis: Bipolar affective disorder, manic, severe (HCC)  Secondary Diagnoses: Principal Problem:   Bipolar affective disorder, manic, severe (HCC) Active Problems:   PTSD (post-traumatic stress disorder)   Cannabis abuse   Current Medications:  Current Facility-Administered Medications  Medication Dose Route Frequency Provider Last Rate Last Admin  . acetaminophen (TYLENOL) tablet 650 mg  650 mg Oral Q6H PRN Clapacs, Jackquline Denmark, MD   650 mg at 05/30/20 2106  . alum & mag hydroxide-simeth (MAALOX/MYLANTA) 200-200-20 MG/5ML suspension 30 mL  30 mL Oral Q4H PRN Clapacs, John T, MD      . hydrOXYzine (ATARAX/VISTARIL) tablet 50 mg  50 mg Oral TID PRN Clapacs, Jackquline Denmark, MD   50 mg at 06/03/20 0801  . magnesium hydroxide (MILK OF MAGNESIA) suspension 30 mL  30 mL Oral Daily PRN Clapacs, John T, MD      . OXcarbazepine (TRILEPTAL) tablet 150 mg  150 mg Oral BID Jesse Sans, MD   150 mg at 06/03/20 4098  . propranolol (INDERAL) tablet 10 mg  10 mg Oral BID Jesse Sans, MD   10 mg at 06/03/20 0802  . temazepam (RESTORIL) capsule 15 mg  15 mg Oral QHS PRN Jesse Sans, MD   15 mg at 06/01/20 2144  . topiramate (TOPAMAX) tablet 25 mg  25 mg Oral BID Jesse Sans, MD   25 mg at 06/03/20 0801  . ziprasidone (GEODON) capsule 20 mg  20 mg Oral Q8H PRN Jesse Sans, MD   20 mg at 05/30/20 1132  . ziprasidone (GEODON) capsule 60 mg  60 mg Oral BID WC Jesse Sans, MD   60 mg at 06/03/20 1191  . ziprasidone (GEODON) injection 20 mg  20 mg Intramuscular Q8H PRN Jesse Sans, MD       PTA Medications: Medications Prior to Admission  Medication Sig Dispense Refill Last Dose  . asenapine (SAPHRIS) 5 MG SUBL 24 hr tablet Place 2 tablets (10 mg total) under the tongue 2 (two) times daily. 60 tablet 1   . hydrOXYzine (ATARAX/VISTARIL) 25 MG  tablet Take 1 tablet (25 mg total) by mouth 2 (two) times daily. 60 tablet 1   . loratadine (CLARITIN) 10 MG tablet Take 1 tablet (10 mg total) by mouth daily. 30 tablet 1   . meloxicam (MOBIC) 15 MG tablet Take 1 tablet (15 mg total) by mouth daily. 30 tablet 1   . norgestimate-ethinyl estradiol (ORTHO-CYCLEN,SPRINTEC,PREVIFEM) 0.25-35 MG-MCG tablet Take 1 tablet by mouth daily. 1 Package 1   . OLANZapine (ZYPREXA) 10 MG tablet Take 10 mg by mouth at bedtime.     Marland Kitchen OLANZapine (ZYPREXA) 5 MG tablet Take 2.5 mg by mouth every morning.     . propranolol (INDERAL) 10 MG tablet Take 1 tablet (10 mg total) by mouth 2 (two) times daily. (Patient taking differently: Take 20 mg by mouth 2 (two) times daily.) 60 tablet 1   . traZODone (DESYREL) 50 MG tablet Take 1 tablet (50 mg total) by mouth at bedtime. 30 tablet 1   . triamcinolone cream (KENALOG) 0.5 % Apply 1 application topically 2 (two) times daily.     . Vitamin D, Ergocalciferol, (DRISDOL) 1.25 MG (50000 UNIT) CAPS capsule Take 50,000 Units by mouth every 7 (seven) days.       Patient Stressors: Financial difficulties Loss of grandmother  Marital or family conflict  Patient Strengths: Average or above average intelligence Communication skills Supportive family/friends Work skills  Treatment Modalities: Medication Management, Group therapy, Case management,  1 to 1 session with clinician, Psychoeducation, Recreational therapy.   Physician Treatment Plan for Primary Diagnosis: Bipolar affective disorder, manic, severe (HCC) Long Term Goal(s): Improvement in symptoms so as ready for discharge Improvement in symptoms so as ready for discharge   Short Term Goals: Ability to identify changes in lifestyle to reduce recurrence of condition will improve Ability to verbalize feelings will improve Ability to disclose and discuss suicidal ideas Ability to demonstrate self-control will improve Ability to identify and develop effective coping  behaviors will improve Ability to maintain clinical measurements within normal limits will improve Compliance with prescribed medications will improve Ability to identify triggers associated with substance abuse/mental health issues will improve Ability to identify changes in lifestyle to reduce recurrence of condition will improve Ability to verbalize feelings will improve Ability to disclose and discuss suicidal ideas Ability to demonstrate self-control will improve Ability to identify and develop effective coping behaviors will improve Ability to maintain clinical measurements within normal limits will improve Compliance with prescribed medications will improve Ability to identify triggers associated with substance abuse/mental health issues will improve  Medication Management: Evaluate patient's response, side effects, and tolerance of medication regimen.  Therapeutic Interventions: 1 to 1 sessions, Unit Group sessions and Medication administration.  Evaluation of Outcomes: Adequate for Discharge  Physician Treatment Plan for Secondary Diagnosis: Principal Problem:   Bipolar affective disorder, manic, severe (HCC) Active Problems:   PTSD (post-traumatic stress disorder)   Cannabis abuse  Long Term Goal(s): Improvement in symptoms so as ready for discharge Improvement in symptoms so as ready for discharge   Short Term Goals: Ability to identify changes in lifestyle to reduce recurrence of condition will improve Ability to verbalize feelings will improve Ability to disclose and discuss suicidal ideas Ability to demonstrate self-control will improve Ability to identify and develop effective coping behaviors will improve Ability to maintain clinical measurements within normal limits will improve Compliance with prescribed medications will improve Ability to identify triggers associated with substance abuse/mental health issues will improve Ability to identify changes in lifestyle  to reduce recurrence of condition will improve Ability to verbalize feelings will improve Ability to disclose and discuss suicidal ideas Ability to demonstrate self-control will improve Ability to identify and develop effective coping behaviors will improve Ability to maintain clinical measurements within normal limits will improve Compliance with prescribed medications will improve Ability to identify triggers associated with substance abuse/mental health issues will improve     Medication Management: Evaluate patient's response, side effects, and tolerance of medication regimen.  Therapeutic Interventions: 1 to 1 sessions, Unit Group sessions and Medication administration.  Evaluation of Outcomes: Adequate for Discharge   RN Treatment Plan for Primary Diagnosis: Bipolar affective disorder, manic, severe (HCC) Long Term Goal(s): Knowledge of disease and therapeutic regimen to maintain health will improve  Short Term Goals: Ability to remain free from injury will improve, Ability to verbalize frustration and anger appropriately will improve, Ability to demonstrate self-control, Ability to participate in decision making will improve, Ability to verbalize feelings will improve, Ability to disclose and discuss suicidal ideas, Ability to identify and develop effective coping behaviors will improve and Compliance with prescribed medications will improve  Medication Management: RN will administer medications as ordered by provider, will assess and evaluate patient's response and provide education to patient for prescribed medication. RN will report any adverse and/or side effects to  prescribing provider.  Therapeutic Interventions: 1 on 1 counseling sessions, Psychoeducation, Medication administration, Evaluate responses to treatment, Monitor vital signs and CBGs as ordered, Perform/monitor CIWA, COWS, AIMS and Fall Risk screenings as ordered, Perform wound care treatments as ordered.  Evaluation  of Outcomes: Adequate for Discharge   LCSW Treatment Plan for Primary Diagnosis: Bipolar affective disorder, manic, severe (HCC) Long Term Goal(s): Safe transition to appropriate next level of care at discharge, Engage patient in therapeutic group addressing interpersonal concerns.  Short Term Goals: Engage patient in aftercare planning with referrals and resources, Increase social support, Increase ability to appropriately verbalize feelings, Increase emotional regulation, Facilitate acceptance of mental health diagnosis and concerns, Identify triggers associated with mental health/substance abuse issues and Increase skills for wellness and recovery  Therapeutic Interventions: Assess for all discharge needs, 1 to 1 time with Social worker, Explore available resources and support systems, Assess for adequacy in community support network, Educate family and significant other(s) on suicide prevention, Complete Psychosocial Assessment, Interpersonal group therapy.  Evaluation of Outcomes: Adequate for Discharge   Progress in Treatment: Attending groups: Yes. Participating in groups: Yes. Taking medication as prescribed: Yes. Toleration medication: Yes. Family/Significant other contact made: Yes, individual(s) contacted:  Marybelle Killings, mother. Patient understands diagnosis: Yes. Discussing patient identified problems/goals with staff: Yes. Medical problems stabilized or resolved: No. Denies suicidal/homicidal ideation: No. Issues/concerns per patient self-inventory: No. Other: None  New problem(s) identified: No, Describe:  None  New Short Term/Long Term Goal(s): Engage patient in aftercare planning with referrals and resources, Increase social support, Increase ability to appropriately verbalize feelings, Increase emotional regulation, Facilitate acceptance of mental health diagnosis and concerns, Identify triggers associated with mental health/substance abuse issues and Increase skills for  wellness and recovery. Update 06/03/20: No changes at this time.  Patient Goals:  "Path to leaving and go outside. Allergy medicine." Update 06/03/20: No changes at this time.  Discharge Plan or Barriers: CSW will assist pt in obtaining transportation and follow-up care upon discharge. Update 06/03/20: Pt to discharge today. Her partner will be providing transportation and CSW will arrange appropriate aftercare.  Reason for Continuation of Hospitalization: Aggression Depression Mania Medication stabilization Suicidal ideation  Estimated Length of Stay: 1-7 days   Recreational Therapy: Patient Stressors: N/A Patient Goal: Patient will engage in groups without prompting or encouragement from LRT x3 group sessions within 5 recreation therapy group sessions.  Attendees: Patient:  06/03/2020 9:26 AM  Physician: Jesse Sans, MD 06/03/2020 9:26 AM  Nursing:   06/03/2020 9:26 AM  RN Care Manager: 06/03/2020 9:26 AM  Social Worker: Gwenevere Ghazi, MSW, [place], Bridget Hartshorn 06/03/2020 9:26 AM  Recreational Therapist: Garret Reddish, Drue Flirt, LRT  06/03/2020 9:26 AM  Other: Kiva [place], MSW, LCSW-A 06/03/2020 9:26 AM  Other: Vilma Meckel. Algis Greenhouse, MSW, [place], LCAS 06/03/2020 9:26 AM  Other: 06/03/2020 9:26 AM    Scribe for Treatment Team: Glenis Smoker, LCSW 06/03/2020 9:26 AM

## 2020-06-03 NOTE — Progress Notes (Signed)
Recreation Therapy Notes   Date: 06/03/2020  Time: 9:30 am  Location: Courtyard   Behavioral response: Appropriate   Intervention Topic: Leisure  Discussion/Intervention:  Group content today was focused on leisure. The group defined what leisure is and some positive leisure activities they participate in. Individuals identified the difference between good and bad leisure. Participants expressed how they feel after participating in the leisure of their choice. The group discussed how they go about picking a leisure activity and if others are involved in their leisure activities. The patient stated how many leisure activities they too choose from and reasons why it is important to have leisure time. Individuals participated in the intervention "Leisure Exploration" where they had a chance to identify new leisure activities as well as benefits of leisure. Clinical Observations/Feedback:  Patient came to group and identified basketball and talking as leisure activities enjoys. [name].   Ricci Paff LRT/CTRS            Davona Kinoshita 06/03/2020 10:27 AM

## 2020-06-03 NOTE — Progress Notes (Signed)
Pt denies SI, HI and AVH. Pt was educated on dc plan.and verbalizes understanding.  Pt received AVS,  transition packet, prescriptions, note and belongings. Torrie Mayers RN

## 2020-06-03 NOTE — [facility] Counselor (Signed)
Important Message  Patient Details  Name: Meline Russaw MRN: [id number] Date of Birth: 11-Jul-1975   Medicare Important Message Given:   Patient informed of right to appeal discharge at this time and provided handout with contact information for [facility]. Patient expressed no interst in appealing discharge and asked about timeline for leaving.      Corky Crafts, LCSWA 06/03/2020, 10:17 AM

## 2020-06-03 NOTE — Discharge Summary (Signed)
Physician Discharge Summary Note  Patient:  Beth Berry is an 45 y.o., female MRN:  [id number] DOB:  07-Jan-1976 Patient phone:  [phone] (home)  Patient address:   [address] [place] [address],  Total Time spent with patient: 35 minutes- 25 minutes face-to-face contact with patient, 10 minutes documentation, coordination of care, scripts   Date of Admission:  05/27/2020 Date of Discharge: 06/03/2020  Reason for Admission:   History of Present Illness: 45 year old female with bipolar disorder vs schizoaffective disorder, bipolar type presenting under IVC for erratic manic behaviors in setting of medication noncompliance  Principal Problem: Bipolar affective disorder, manic, severe (HCC) Discharge Diagnoses: Principal Problem:   Bipolar affective disorder, manic, severe (HCC) Active Problems:   PTSD (post-traumatic stress disorder)   Cannabis abuse   Past Psychiatric History:  History of Present Illness: 45 year old female with bipolar disorder vs schizoaffective disorder, bipolar type presenting under IVC for erratic manic behaviors in setting of medication noncompliance  Past Medical History:  Past Medical History:  Diagnosis Date  . Bipolar 1 disorder (HCC)   . Contusion of great toe of right foot 06/11/2011  . Drug abuse (HCC)   . Schizophrenia [facility])     Past Surgical History:  Procedure Laterality Date  . DENTAL SURGERY     Family History:  Family History  Problem Relation Age of Onset  . Hypertension Mother   . Drug abuse Mother   . Drug abuse Father    Family Psychiatric  History: Paternal aunt completed suicide after learning of cancer diagnosis, mother and father with substance abuse  Social History:  Social History   Substance and Sexual Activity  Alcohol Use Yes   Comment: rarely     Social History   Substance and Sexual Activity  Drug Use Yes  . Types: Heroin, MDMA (Ecstacy), Cocaine   Comment: none now, hx of marijuana,  narcotic, heroin, cocaine- last use 3 years    Social History   Socioeconomic History  . Marital status: Significant Other    Spouse name: Not on file  . Number of children: Not on file  . Years of education: Not on file  . Highest education level: Not on file  Occupational History  . Not on file  Tobacco Use  . Smoking status: Current Some Day Smoker    Packs/day: 1.50    Types: Cigarettes  . Smokeless tobacco: Never Used  Substance and Sexual Activity  . Alcohol use: Yes    Comment: rarely  . Drug use: Yes    Types: Heroin, MDMA (Ecstacy), Cocaine    Comment: none now, hx of marijuana, narcotic, heroin, cocaine- last use 3 years  . Sexual activity: Yes    Birth control/protection: None  Other Topics Concern  . Not on file  Social History Narrative  . Not on file   Social Determinants of Health   Financial Resource Strain: Not on file  Food Insecurity: Not on file  Transportation Needs: Not on file  Physical Activity: Not on file  Stress: Not on file  Social Connections: Not on file    Hospital Course:  45 year old female with bipolar disorder vs schizoaffective disorder, bipolar type presenting under IVC for erratic manic behaviors in setting of medication noncompliance. While on the unit she was started on Geodon and titrated to 60 mg BID with meals. She was continued on propranolol 10 mg BID, Trileptal 150 mg BID, and Topamax 25 mg BID. She was also given Temazepam 15 mg QHS  PRN for sleep, and vistaril 50 mg TID PRN for anxiety. While on the unit her mood gradually stabilized. She became less irritable, and more cooperative with staff. Paranoia towards her payee stealing her money resolved. 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 At time of discharge she denied suicidal ideations, homicidal ideations, visual hallucinations, and auditory hallucinations. Girlfriend and sister/payee felt comfortable with Beth Berry picking  her up to return to pack her belongings, and move into a new apartment in [place]. She will continue outpatient mental health follow-up.   Physical Findings: AIMS: Facial and Oral Movements Muscles of Facial Expression: None, normal Lips and Perioral Area: None, normal Jaw: None, normal Tongue: None, normal,Extremity Movements Upper (arms, wrists, hands, fingers): None, normal Lower (legs, knees, ankles, toes): None, normal, Trunk Movements Neck, shoulders, hips: None, normal, Overall Severity Severity of abnormal movements (highest score from questions above): None, normal Incapacitation due to abnormal movements: None, normal Patient's awareness of abnormal movements (rate only patient's report): No Awareness, Dental Status Current problems with teeth and/or dentures?: No Does patient usually wear dentures?: No  CIWA:    COWS:     Musculoskeletal: Strength & Muscle Tone: within normal limits Gait & Station: normal Patient leans: N/A    Psychiatric Specialty Exam: General Appearance: Fairly Groomed  Patent attorneyye Contact::  Good  Speech:  Clear and Coherent and Normal Rate  Volume:  Normal  Mood:  Euthymic  Affect:  Congruent  Thought Process:  Coherent and Linear  Orientation:  Full (Time, Place, and Person)  Thought Content:  Logical  Suicidal Thoughts:  No  Homicidal Thoughts:  No  Memory:  Immediate;   Fair Recent;   Fair Remote;   Fair  Judgement:  Intact  Insight:  Fair  Psychomotor Activity:  Normal  Concentration:  Fair  Recall:  FiservFair  Fund of Knowledge:Fair  Language: Fair  Akathisia:  Negative  Handed:  Right  AIMS (if indicated):     Assets:  Communication Skills Desire for Improvement Financial Resources/Insurance Housing Physical Health Resilience Social Support Talents/Skills Transportation Vocational/Educational  Sleep:  Number of Hours: 7.25  Cognition: WNL  ADL's:  Intact     Physical Exam: Physical Exam Vitals and nursing note reviewed.   Constitutional:      Appearance: Normal appearance.  HENT:     Head: Normocephalic and atraumatic.     Right Ear: External ear normal.     Left Ear: External ear normal.     Nose: Nose normal.     Mouth/Throat:     Mouth: Mucous membranes are moist.     Pharynx: Oropharynx is clear.  Eyes:     Extraocular Movements: Extraocular movements intact.     Conjunctiva/sclera: Conjunctivae normal.  Cardiovascular:     Rate and Rhythm: Normal rate.     Pulses: Normal pulses.  Pulmonary:     Effort: Pulmonary effort is normal.     Breath sounds: Normal breath sounds.  Abdominal:     General: Abdomen is flat.     Palpations: Abdomen is soft.  Musculoskeletal:        General: No swelling. Normal range of motion.     Cervical back: Normal range of motion and neck supple.  Skin:    General: Skin is warm and dry.  Neurological:     General: No focal deficit present.     Mental Status: She is alert and oriented to person, place, and time.  Psychiatric:        Mood and Affect: Mood normal.        Behavior: Behavior normal.        Thought Content: Thought content normal.        Judgment: Judgment normal.    Review of Systems  Constitutional: Negative.   HENT: Negative.   Eyes: Negative.   Respiratory: Negative.   Cardiovascular: Negative.   Gastrointestinal: Negative.   Endocrine: Negative.   Genitourinary: Negative.   Musculoskeletal: Negative.   Allergic/Immunologic: Negative.   Neurological: Negative.   Hematological: Negative.   Psychiatric/Behavioral: Negative for behavioral problems, hallucinations, sleep disturbance and suicidal ideas.  Blood pressure 116/68, pulse 82, temperature 97.9 F (36.6 C), temperature source Oral, resp. rate 18, height 5\' 7"  (1.702 m), weight 95.3 kg, SpO2 99 %. Body mass index is 32.89 kg/m.   Have you used any form of tobacco in the last 30 days? (Cigarettes, Smokeless Tobacco, Cigars, and/or Pipes): Yes  Has this patient used any form of  tobacco in the last 30 days? (Cigarettes, Smokeless Tobacco, Cigars, and/or Pipes)  Yes, A prescription for an FDA-approved tobacco cessation medication was offered at discharge and the patient refused  Blood Alcohol level:  Lab Results  Component Value Date   [facility] <10 05/27/2020   ETH <10 04/06/2018    Metabolic Disorder Labs:  Lab Results  Component Value Date   HGBA1C 5.2 05/28/2020   MPG 102.54 05/28/2020   MPG 116.89 04/08/2018   No results found for: PROLACTIN Lab Results  Component Value Date   CHOL 140 05/28/2020   TRIG 73 05/28/2020   HDL 40 (L) 05/28/2020   CHOLHDL 3.5 05/28/2020   VLDL 15 05/28/2020   LDLCALC 85 05/28/2020   LDLCALC 73 04/08/2018    See Psychiatric Specialty Exam and Suicide Risk Assessment completed by Attending Physician prior to discharge.  Discharge destination:  Home  Is patient on multiple antipsychotic therapies at discharge:  No   Has Patient had three or more failed trials of antipsychotic monotherapy by history:  No  Recommended Plan for Multiple Antipsychotic Therapies: NA  Discharge Instructions    Diet general   Complete by: As directed    Increase activity slowly   Complete by: As directed      Allergies as of 06/03/2020      Reactions   Onion Itching   Shellfish Allergy Hives   Haloperidol Hives   Lamictal [lamotrigine] Itching, Other (See Comments)   Caused "stevens johnson syndrome"      Medication List    STOP taking these medications   asenapine 5 MG Subl 24 hr tablet Commonly known as: SAPHRIS   OLANZapine 10 MG tablet Commonly known as: ZYPREXA   OLANZapine 5 MG tablet Commonly known as: ZYPREXA   traZODone 50 MG tablet Commonly known as: DESYREL   triamcinolone cream 0.5 % Commonly known as: KENALOG     TAKE these medications     Indication  hydrOXYzine 50 MG tablet Commonly known as: ATARAX/VISTARIL Take 1 tablet (50 mg total) by mouth 3 (three) times daily as needed for anxiety. What  changed:   medication strength  how much to take  when to take this  reasons to take this  Indication: Feeling Anxious   loratadine 10 MG tablet Commonly known as: CLARITIN Take 1 tablet (10 mg total) by mouth daily.  Indication: Upper Respiratory Tract Allergy   meloxicam 15 MG tablet Commonly known as: MOBIC Take 1 tablet (15 mg  total) by mouth daily.  Indication: Joint Damage causing Pain and Loss of Function   norgestimate-ethinyl estradiol 0.25-35 MG-MCG tablet Commonly known as: ORTHO-CYCLEN Take 1 tablet by mouth daily.  Indication: Birth Control Treatment   OXcarbazepine 150 MG tablet Commonly known as: TRILEPTAL Take 1 tablet (150 mg total) by mouth 2 (two) times daily.  Indication: Bipolar Disorder   propranolol 10 MG tablet Commonly known as: INDERAL Take 1 tablet (10 mg total) by mouth 2 (two) times daily. What changed: how much to take  Indication: Feeling Anxious   temazepam 15 MG capsule Commonly known as: RESTORIL Take 1 capsule (15 mg total) by mouth at bedtime as needed for sleep.  Indication: Trouble Sleeping   topiramate 25 MG tablet Commonly known as: TOPAMAX Take 1 tablet (25 mg total) by mouth 2 (two) times daily.  Indication: Antipsychotic Therapy-Induced Weight Gain   Vitamin D (Ergocalciferol) 1.25 MG (50000 UNIT) Caps capsule Commonly known as: DRISDOL Take 50,000 Units by mouth every 7 (seven) days.  Indication: Vitamin D Deficiency   ziprasidone 60 MG capsule Commonly known as: GEODON Take 1 capsule (60 mg total) by mouth 2 (two) times daily with a meal.  Indication: Manic-Depression       Follow-up Information    [facility]. Go to.   Why: Please present for scheduled appointment on 06/09/20.  Contact information: Mosetta Anis, NP Nurse Practitioner NPI: [id number] [address] [place] [address]                Follow-up recommendations:  Activity:  as tolerated Diet:  regular  diet  Comments:  Printed 30-day scripts with one refill provided to patient at discharge  Signed: Jesse Sans, MD 06/03/2020, 9:21 AM

## 2020-06-03 NOTE — BHH Suicide Risk Assessment (Signed)
[facility] Discharge Suicide Risk Assessment   Principal Problem: Bipolar affective disorder, manic, severe (HCC) Discharge Diagnoses: Principal Problem:   Bipolar affective disorder, manic, severe (HCC) Active Problems:   PTSD (post-traumatic stress disorder)   Cannabis abuse   Non compliance w medication regimen   Total Time spent with patient: 35 minutes- 25 minutes face-to-face contact with patient, 10 minutes documentation, coordination of care, scripts   Musculoskeletal: Strength & Muscle Tone: within normal limits Gait & Station: normal Patient leans: N/A  Psychiatric Specialty Exam: Review of Systems  Constitutional: Negative.   HENT: Negative.   Eyes: Negative.   Respiratory: Negative.   Cardiovascular: Negative.   Gastrointestinal: Negative.   Endocrine: Negative.   Genitourinary: Negative.   Musculoskeletal: Negative.   Allergic/Immunologic: Negative.   Neurological: Negative.   Hematological: Negative.   Psychiatric/Behavioral: Negative for behavioral problems, hallucinations, sleep disturbance and suicidal ideas.    Blood pressure 116/68, pulse 82, temperature 97.9 F (36.6 C), temperature source Oral, resp. rate 18, height 5\' 7"  (1.702 m), weight 95.3 kg, SpO2 99 %.Body mass index is 32.89 kg/m.  General Appearance: Fairly Groomed  ::  Good  Speech:  Clear and Coherent and Normal Rate  Volume:  Normal  Mood:  Euthymic  Affect:  Congruent  Thought Process:  Coherent and Linear  Orientation:  Full (Time, Place, and Person)  Thought Content:  Logical  Suicidal Thoughts:  No  Homicidal Thoughts:  No  Memory:  Immediate;   Fair Recent;   Fair Remote;   Fair  Judgement:  Intact  Insight:  Fair  Psychomotor Activity:  Normal  Concentration:  Fair  Recall:  [phone] of Knowledge:Fair  Language: Fair  Akathisia:  Negative  Handed:  Right  AIMS (if indicated):     Assets:  Communication Skills Desire for Improvement Financial  Resources/Insurance Housing Physical Health Resilience Social Support Talents/Skills Transportation Vocational/Educational  Sleep:  Number of Hours: 7.25  Cognition: WNL  ADL's:  Intact   Mental Status Per Nursing Assessment::   On Admission:  NA  Demographic Factors:  Unemployed  Loss Factors: Legal issues  Historical Factors: Prior suicide attempts and Impulsivity  Risk Reduction Factors:   Sense of responsibility to family, Living with another person, especially a relative, Positive social support, Positive therapeutic relationship and Positive coping skills or problem solving skills  Continued Clinical Symptoms:  Severe Anxiety and/or Agitation Bipolar Disorder:   Mixed State Previous Psychiatric Diagnoses and Treatments  Cognitive Features That Contribute To Risk:  None    Suicide Risk:  Minimal: No identifiable suicidal ideation.  Patients presenting with no risk factors but with morbid ruminations; may be classified as minimal risk based on the severity of the depressive symptoms   Follow-up Information    [facility]. Go to.   Why: Please present for scheduled appointment on 06/09/20.  Contact information: 06/11/20, NP Nurse Practitioner NPI: Mosetta Anis [address] [place]                Plan Of Care/Follow-up recommendations:  Activity:  as tolerated Diet:  regular diet  [id number], MD 06/03/2020, 9:15 AM

## 2020-06-03 NOTE — Plan of Care (Signed)
Pt denies depression, SI,HI and AVH. Pt was educated on care plan and verbalizes understanding. Pt anticipates discharge. Torrie Mayers RN Problem: Education: Goal: Knowledge of [facility] Education information/materials will improve Outcome: Adequate for Discharge Goal: Emotional status will improve Outcome: Adequate for Discharge Goal: Mental status will improve Outcome: Adequate for Discharge Goal: Verbalization of understanding the information provided will improve Outcome: Adequate for Discharge   Problem: Activity: Goal: Interest or engagement in activities will improve Outcome: Adequate for Discharge Goal: Sleeping patterns will improve Outcome: Adequate for Discharge   Problem: Coping: Goal: Ability to verbalize frustrations and anger appropriately will improve Outcome: Adequate for Discharge Goal: Ability to demonstrate self-control will improve Outcome: Adequate for Discharge   Problem: Health Behavior/Discharge Planning: Goal: Identification of resources available to assist in meeting health care needs will improve Outcome: Adequate for Discharge Goal: Compliance with treatment plan for underlying cause of condition will improve Outcome: Adequate for Discharge   Problem: Physical Regulation: Goal: Ability to maintain clinical measurements within normal limits will improve Outcome: Adequate for Discharge   Problem: Safety: Goal: Periods of time without injury will increase Outcome: Adequate for Discharge   Problem: Education: Goal: Utilization of techniques to improve thought processes will improve Outcome: Adequate for Discharge Goal: Knowledge of the prescribed therapeutic regimen will improve Outcome: Adequate for Discharge   Problem: Activity: Goal: Interest or engagement in leisure activities will improve Outcome: Adequate for Discharge Goal: Imbalance in normal sleep/wake cycle will improve Outcome: Adequate for Discharge   Problem:  Coping: Goal: Coping ability will improve Outcome: Adequate for Discharge Goal: Will verbalize feelings Outcome: Adequate for Discharge   Problem: Health Behavior/Discharge Planning: Goal: Ability to make decisions will improve Outcome: Adequate for Discharge Goal: Compliance with therapeutic regimen will improve Outcome: Adequate for Discharge   Problem: Role Relationship: Goal: Will demonstrate positive changes in social behaviors and relationships Outcome: Adequate for Discharge   Problem: Safety: Goal: Ability to disclose and discuss suicidal ideas will improve Outcome: Adequate for Discharge Goal: Ability to identify and utilize support systems that promote safety will improve Outcome: Adequate for Discharge   Problem: Self-Concept: Goal: Will verbalize positive feelings about self Outcome: Adequate for Discharge Goal: Level of anxiety will decrease Outcome: Adequate for Discharge   Problem: Education: Goal: Ability to state activities that reduce stress will improve Outcome: Adequate for Discharge   Problem: Coping: Goal: Ability to identify and develop effective coping behavior will improve Outcome: Adequate for Discharge   Problem: Self-Concept: Goal: Ability to identify factors that promote anxiety will improve Outcome: Adequate for Discharge Goal: Level of anxiety will decrease Outcome: Adequate for Discharge Goal: Ability to modify response to factors that promote anxiety will improve Outcome: Adequate for Discharge

## 2020-06-03 NOTE — Progress Notes (Signed)
  [facility] Adult Case Management Discharge Plan :  Will you be returning to the same living situation after discharge:  No. At discharge, do you have transportation home?: Yes,  Patient to be transported by significant other.  Do you have the ability to pay for your medications: Yes,  Medicare Part A   Release of information consent forms completed and in the chart;  Patient's signature needed at discharge.  Patient to Follow up at:  Follow-up Information    [facility]. Go to.   Why: Please present for scheduled appointment on 06/09/20.  Contact information: Mosetta Anis, NP Nurse Practitioner NPI: [id number] [address] [place] [address]                Next level of care provider has access to [facility] Link:no  Safety Planning and Suicide Prevention discussed: Yes,  SPE completed with patient and mother.   Have you used any form of tobacco in the last 30 days? (Cigarettes, Smokeless Tobacco, Cigars, and/or Pipes): Yes  Has patient been referred to the Quitline?: Patient refused referral  Patient has been referred for addiction treatment: Pt. refused referral  Corky Crafts, LCSWA 06/03/2020, 9:39 AM

## 2020-06-03 NOTE — Progress Notes (Signed)
Recreation Therapy Notes  INPATIENT RECREATION TR PLAN  Patient Details Name: Beth Berry MRN: [id number] DOB: 1975-09-12 Today's Date: 06/03/2020  Rec Therapy Plan Is patient appropriate for Therapeutic Recreation?: Yes Treatment times per week: at least 3 Estimated Length of Stay: 5-7 days TR Treatment/Interventions: Group participation (Comment)  Discharge Criteria Pt will be discharged from therapy if:: Discharged Treatment plan/goals/alternatives discussed and agreed upon by:: Patient/family  Discharge Summary Short term goals set: Patient will successfully identify 2 ways of making healthy decisions post d/c within 5 recreation therapy group sessions Short term goals met: Complete Progress toward goals comments: Groups attended Which groups?: Leisure education,Communication,Goal setting,Stress management Reason goals not met: N/A Therapeutic equipment acquired: N/A Reason patient discharged from therapy: Discharge from hospital Pt/family agrees with progress & goals achieved: Yes Date patient discharged from therapy: 06/03/20   [name] 06/03/2020, 10:29 AM

## 2022-10-23 ENCOUNTER — Other Ambulatory Visit: Payer: Self-pay

## 2022-10-23 ENCOUNTER — Emergency Department: Payer: No Typology Code available for payment source

## 2022-10-23 ENCOUNTER — Emergency Department
Admission: EM | Admit: 2022-10-23 | Discharge: 2022-10-23 | Disposition: A | Discharge status: Home / Self Care | Payer: No Typology Code available for payment source | Attending: Emergency Medicine | Admitting: Emergency Medicine

## 2022-10-23 ENCOUNTER — Encounter: Payer: Self-pay | Admitting: Emergency Medicine

## 2022-10-23 DIAGNOSIS — Y92410 Unspecified street and highway as the place of occurrence of the external cause: Secondary | ICD-10-CM | POA: Diagnosis not present

## 2022-10-23 DIAGNOSIS — S50811A Abrasion of right forearm, initial encounter: Secondary | ICD-10-CM | POA: Insufficient documentation

## 2022-10-23 DIAGNOSIS — S59911A Unspecified injury of right forearm, initial encounter: Secondary | ICD-10-CM | POA: Diagnosis present

## 2022-10-23 MED ORDER — CEPHALEXIN 500 MG PO CAPS
500.0000 mg | ORAL_CAPSULE | Freq: Once | ORAL | Status: AC
  Administered 2022-10-23: 500 mg via ORAL
  Filled 2022-10-23: qty 1

## 2022-10-23 MED ORDER — MELOXICAM 15 MG PO TABS: 15.0000 mg | ORAL_TABLET | Freq: Every day | ORAL | 0 refills | Status: AC

## 2022-10-23 MED ORDER — CEPHALEXIN 500 MG PO CAPS: 500.0000 mg | ORAL_CAPSULE | Freq: Four times a day (QID) | ORAL | 0 refills | Status: AC

## 2022-10-23 MED ORDER — BACITRACIN ZINC 500 UNIT/GM EX OINT: TOPICAL_OINTMENT | Freq: Two times a day (BID) | CUTANEOUS | Status: DC

## 2022-10-23 NOTE — Discharge Instructions (Signed)
Your x-rays were negative today.  This is reassuring.  Please take the antibiotics as prescribed.  You can continue to put triple antibiotic ointment over the abrasion and cover with a bandage.  Please take the meloxicam once a day for 2 weeks.  This will help address the inflammation that I believe is causing your pain.  You can follow-up with orthopedics if you are not seeing any improvement in your pain after a week.  Their information is attached.
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
